# Patient Record
Sex: Male | Born: 1985 | Race: White | Hispanic: Yes | Marital: Married | Smoking: Former smoker
Health system: Southern US, Community
[De-identification: ages and names within clinical notes are randomized; demographics above are authoritative.]

## PROBLEM LIST (undated history)

## (undated) DIAGNOSIS — F419 Anxiety disorder, unspecified: Secondary | ICD-10-CM

---

## 2005-05-30 ENCOUNTER — Emergency Department (HOSPITAL_COMMUNITY): Admission: EM | Admit: 2005-05-30 | Discharge: 2005-05-30 | Payer: Self-pay | Admitting: *Deleted

## 2005-10-28 ENCOUNTER — Emergency Department (HOSPITAL_COMMUNITY): Admission: EM | Admit: 2005-10-28 | Discharge: 2005-10-28 | Payer: Self-pay | Admitting: *Deleted

## 2006-01-10 ENCOUNTER — Emergency Department (HOSPITAL_COMMUNITY): Admission: EM | Admit: 2006-01-10 | Discharge: 2006-01-10 | Payer: Self-pay | Admitting: Emergency Medicine

## 2011-09-28 ENCOUNTER — Emergency Department (HOSPITAL_COMMUNITY)
Admission: EM | Admit: 2011-09-28 | Discharge: 2011-09-29 | Disposition: A | Discharge status: Home / Self Care | Payer: Self-pay | Attending: Emergency Medicine | Admitting: Emergency Medicine

## 2011-09-28 ENCOUNTER — Encounter: Payer: Self-pay | Admitting: Emergency Medicine

## 2011-09-28 DIAGNOSIS — R509 Fever, unspecified: Secondary | ICD-10-CM | POA: Insufficient documentation

## 2011-09-28 DIAGNOSIS — H9209 Otalgia, unspecified ear: Secondary | ICD-10-CM | POA: Insufficient documentation

## 2011-09-28 DIAGNOSIS — R070 Pain in throat: Secondary | ICD-10-CM | POA: Insufficient documentation

## 2011-09-28 DIAGNOSIS — J111 Influenza due to unidentified influenza virus with other respiratory manifestations: Secondary | ICD-10-CM | POA: Insufficient documentation

## 2011-09-28 DIAGNOSIS — IMO0001 Reserved for inherently not codable concepts without codable children: Secondary | ICD-10-CM | POA: Insufficient documentation

## 2011-09-28 DIAGNOSIS — R000 Tachycardia, unspecified: Secondary | ICD-10-CM | POA: Insufficient documentation

## 2011-09-28 DIAGNOSIS — R05 Cough: Secondary | ICD-10-CM | POA: Insufficient documentation

## 2011-09-28 DIAGNOSIS — R059 Cough, unspecified: Secondary | ICD-10-CM | POA: Insufficient documentation

## 2011-09-28 DIAGNOSIS — J3489 Other specified disorders of nose and nasal sinuses: Secondary | ICD-10-CM | POA: Insufficient documentation

## 2011-09-28 MED ORDER — ACETAMINOPHEN 325 MG PO TABS
650.0000 mg | ORAL_TABLET | Freq: Once | ORAL | Status: AC
  Administered 2011-09-28: 650 mg via ORAL
  Filled 2011-09-28: qty 2

## 2011-09-28 NOTE — ED Notes (Signed)
PT. REPORTS PERSISTENT PRODUCTIVE COUGH FOR 3 DAYS WITH LEFT EAR ACHE ,  NASAL CONGESTION / RUNNY NOSE / CHILLS.

## 2011-09-29 ENCOUNTER — Emergency Department (HOSPITAL_COMMUNITY): Payer: Self-pay

## 2011-09-29 MED ORDER — SODIUM CHLORIDE 0.9 % IV BOLUS (SEPSIS)
2000.0000 mL | Freq: Once | INTRAVENOUS | Status: AC
  Administered 2011-09-29: 2000 mL via INTRAVENOUS

## 2011-09-29 MED ORDER — DESLORATADINE 5 MG PO TABS: 5.0000 mg | ORAL_TABLET | Freq: Every day | ORAL | Status: AC

## 2011-09-29 MED ORDER — KETOROLAC TROMETHAMINE 30 MG/ML IJ SOLN
30.0000 mg | Freq: Once | INTRAMUSCULAR | Status: AC
  Administered 2011-09-29: 30 mg via INTRAVENOUS
  Filled 2011-09-29 (×2): qty 1

## 2011-09-29 NOTE — ED Notes (Signed)
Patient transported to X-ray 

## 2011-09-29 NOTE — ED Notes (Signed)
The pt has had cold cough and aching all over for 3-4 days.  No distress

## 2011-09-29 NOTE — ED Provider Notes (Signed)
History     CSN: [id number]  Arrival date & time 09/28/11  2315   First MD Initiated Contact with Patient 09/29/11 0259      Chief Complaint  Patient presents with  . Cough    (Consider location/radiation/quality/duration/timing/severity/associated sxs/prior treatment) Patient is a 25 y.o. male presenting with cough. The history is provided by the patient. No language interpreter was used.  Cough The current episode started more than 2 days ago. The problem occurs constantly. The problem has not changed since onset.The cough is productive of sputum. The maximum temperature recorded prior to his arrival was 103 to 104 F. The fever has been present for 3 to 4 days. Associated symptoms include ear pain, rhinorrhea, sore throat and myalgias. Pertinent negatives include no chest pain, no sweats, no weight loss, no ear congestion, no headaches, no shortness of breath, no wheezing and no eye redness. He has tried nothing for the symptoms. The treatment provided no relief. Risk factors: none, no flu shot. He is not a smoker. His past medical history does not include emphysema or asthma.    History reviewed. No pertinent past medical history.  History reviewed. No pertinent past surgical history.  No family history on file.  History  Substance Use Topics  . Smoking status: Never Smoker   . Smokeless tobacco: Not on file  . Alcohol Use: No      Review of Systems  Constitutional: Positive for fever. Negative for weight loss, activity change and fatigue.  HENT: Positive for ear pain, sore throat and rhinorrhea. Negative for neck pain and neck stiffness.   Eyes: Negative for redness.  Respiratory: Positive for cough. Negative for shortness of breath and wheezing.   Cardiovascular: Negative for chest pain.  Gastrointestinal: Negative for nausea, vomiting, abdominal pain, diarrhea and abdominal distention.  Genitourinary: Negative for difficulty urinating.  Musculoskeletal: Positive for  myalgias.  Skin: Negative.   Neurological: Negative for headaches.  Hematological: Negative.   Psychiatric/Behavioral: Negative.     Allergies  Review of patient's allergies indicates no known allergies.  Home Medications  No current outpatient prescriptions on file.  BP 135/83  Pulse 140  Temp(Src) 103.2 F (39.6 C) (Oral)  Resp 18  SpO2 99%  Physical Exam  Constitutional: He is oriented to person, place, and time. He appears well-developed and well-nourished. No distress.  HENT:  Head: Normocephalic and atraumatic.  Right Ear: Tympanic membrane is not injected.  Left Ear: Tympanic membrane is not injected.  Mouth/Throat: No oropharyngeal exudate.  Eyes: EOM are normal. Pupils are equal, round, and reactive to light.  Neck: Normal range of motion. Neck supple. No thyromegaly present.  Cardiovascular: Tachycardia present.   Pulmonary/Chest: Effort normal. He has no wheezes. He has no rales.  Abdominal: Soft. Bowel sounds are normal. There is no tenderness. There is no rebound and no guarding.  Musculoskeletal: Normal range of motion.  Lymphadenopathy:    He has no cervical adenopathy.  Neurological: He is oriented to person, place, and time.  Skin: Skin is warm and dry. No rash noted. He is not diaphoretic.  Psychiatric: Thought content normal.    ED Course  Procedures (including critical care time)   Labs Reviewed  RAPID STREP SCREEN   No results found.   No diagnosis found.    MDM  Return for worsening symptoms        Makynzi Eastland K Lyly Canizales-Rasch, MD 09/29/11 [phone]

## 2012-05-20 ENCOUNTER — Ambulatory Visit: Payer: Self-pay | Admitting: Internal Medicine

## 2012-05-20 VITALS — BP 144/79 | HR 93 | Temp 99.1°F | Resp 18 | Ht 68.5 in | Wt 204.2 lb

## 2012-05-20 DIAGNOSIS — M79669 Pain in unspecified lower leg: Secondary | ICD-10-CM

## 2012-05-20 DIAGNOSIS — M79609 Pain in unspecified limb: Secondary | ICD-10-CM

## 2012-05-20 MED ORDER — ACYCLOVIR 800 MG PO TABS: ORAL_TABLET | ORAL | Status: DC

## 2012-05-20 MED ORDER — MELOXICAM 15 MG PO TABS: 15.0000 mg | ORAL_TABLET | Freq: Every day | ORAL | Status: AC

## 2012-05-20 NOTE — Progress Notes (Signed)
  Subjective:    Patient ID: Derek Newton, male    DOB: 06-28-1986, 26 y.o.   MRN: [id number]  HPIComplaining of pain and numbness extending from his right outer hip to his right outer ankle for the last 24 hours No known injury He hurts with movement more than when sitting still but had trouble sleeping last night due to this discomfort He noticed a red blisterlike lesion on his right knee last night/still red today but no blister His areas of skin over his hip which he says are sensitive but no other rash Denies fever chills or night sweats Genitourinary symptoms or gastrointestinal symptoms    Review of Systems No current medications or illnesses    Objective:   Physical Exam Vital signs stable Abdomen supple Lumbosacral spine full range of motion without pain on palpation Right hip area tender over the lateral aspect and into the lateral thigh to the knee Decreased sensation to light touch however that however hip thigh and lower leg No clear vesicular rash not resolving red lesion on the Hypersensitive to touch over the buttock without pain on deep palpation Hip has good external and internal rotation without pain/also flexion Straight leg raise produces symptoms along the course of the tender area laterally Deep tendon reflexes are symmetrical There is no complete sensory loss His gait is slow at first but then not affected       Assessment & Plan:  Problem #1 pain and paresthesia with hyperesthesia in a dermatomal distribution  Although the etiology is uncertain, coverage for zoster is indicated He defers hip x-ray No CBC since no fever Close followup necessary Meds ordered this encounter  Medications  . meloxicam (MOBIC) 15 MG tablet    Sig: Take 1 tablet (15 mg total) by mouth daily.    Dispense:  30 tablet    Refill:  0  . acyclovir (ZOVIRAX) 800 MG tablet    Sig: Take 5 tablets a day by taking one every 4 hours while awake    Dispense:  35 tablet   Refill:  0    You may give him 2 are 400 mg tablets if the 800 mg tablets are too expensive   Recheck on Sunday morning-5 days

## 2015-07-17 ENCOUNTER — Ambulatory Visit (INDEPENDENT_AMBULATORY_CARE_PROVIDER_SITE_OTHER): Payer: Self-pay | Admitting: Emergency Medicine

## 2015-07-17 ENCOUNTER — Ambulatory Visit (INDEPENDENT_AMBULATORY_CARE_PROVIDER_SITE_OTHER): Payer: Self-pay

## 2015-07-17 ENCOUNTER — Other Ambulatory Visit: Payer: Self-pay | Admitting: Emergency Medicine

## 2015-07-17 VITALS — BP 128/70 | HR 90 | Temp 99.0°F | Resp 16 | Ht 69.5 in | Wt 184.0 lb

## 2015-07-17 DIAGNOSIS — M79605 Pain in left leg: Secondary | ICD-10-CM

## 2015-07-17 DIAGNOSIS — M545 Low back pain, unspecified: Secondary | ICD-10-CM

## 2015-07-17 DIAGNOSIS — R202 Paresthesia of skin: Secondary | ICD-10-CM

## 2015-07-17 DIAGNOSIS — M461 Sacroiliitis, not elsewhere classified: Secondary | ICD-10-CM

## 2015-07-17 MED ORDER — TRAMADOL HCL 50 MG PO TABS: 50.0000 mg | ORAL_TABLET | Freq: Three times a day (TID) | ORAL | Status: DC | PRN

## 2015-07-17 MED ORDER — CYCLOBENZAPRINE HCL 10 MG PO TABS: ORAL_TABLET | ORAL | Status: DC

## 2015-07-17 MED ORDER — MELOXICAM 15 MG PO TABS: 15.0000 mg | ORAL_TABLET | Freq: Every day | ORAL | Status: DC

## 2015-07-17 NOTE — Progress Notes (Addendum)
Patient ID: Derek Newton, male   DOB: 1985-11-12, 29 y.o.   MRN: [id number]     This chart was scribed for Derek ChrisSteven Arelyn Gauer, MD by Littie Deedsichard Sun, Medical Scribe. This patient was seen in room 13 and the patient's care was started at 3:58 PM.   Chief Complaint:  Chief Complaint  Patient presents with  . Leg Pain    left, x 3 days    HPI: Derek Newton is a 29 y.o. male [facility] complaining of left leg pain from his buttock radiating down his posterior leg that started 4 days ago. Patient reports having associated numbness and tingling in his left leg. The pain is not worse with movement of the hip. The pain is worse when bearing weight. He has tried Tylenol and ibuprofen for the pain with minimal relief. Patient denies back pain. No medical problems per patient.  Patient works at a [employer] shop, which is physically intensive. He has worked every day since the pain started.  History reviewed. No pertinent past medical history. History reviewed. No pertinent past surgical history. Social History   Social History  . Marital Status: Single    Spouse Name: N/A  . Number of Children: N/A  . Years of Education: N/A   Social History Main Topics  . Smoking status: Former Smoker    Types: Cigarettes    Quit date: 10/20/2005  . Smokeless tobacco: None  . Alcohol Use: No  . Drug Use: No  . Sexual Activity: Not Asked   Other Topics Concern  . None   Social History Narrative   History reviewed. No pertinent family history. No Known Allergies Prior to Admission medications   Medication Sig Start Date End Date Taking? Authorizing Provider  acyclovir (ZOVIRAX) 800 MG tablet Take 5 tablets a day by taking one every 4 hours while awake Patient not taking: Reported on 07/17/2015 05/20/12   Tonye Pearsonobert P Doolittle, MD  desloratadine (CLARINEX) 5 MG tablet Take 1 tablet (5 mg total) by mouth daily. 09/29/11 09/28/12  Derek Palumbo, MD     ROS: The patient denies fevers, chills,  night sweats, unintentional weight loss, chest pain, palpitations, wheezing, dyspnea on exertion, nausea, vomiting, abdominal pain, dysuria, hematuria, melena.  All other systems have been reviewed and were otherwise negative with the exception of those mentioned in the HPI and as above.    PHYSICAL EXAM: Filed Vitals:   07/17/15 1417  BP: 128/70  Pulse: 90  Temp: 99 F (37.2 C)  Resp: 16   Body mass index is 26.79 kg/(m^2).   General: Alert, no acute distress HEENT:  Normocephalic, atraumatic, oropharynx patent. Eye[facility] Non[facility]OMI[facility] Cardiovascular:  Regular rate and rhythm, no rubs murmurs or gallops.  No Carotid bruits, radial pulse intact. No pedal edema.  Respiratory: Clear to auscultation bilaterally.  No wheezes, rales, or rhonchi.  No cyanosis, no use of accessory musculature Abdominal: No organomegaly, abdomen is soft and non-tender, positive bowel sounds.  No masses. Musculoskeletal: No tenderness over lower lumbar spine. He is able to internal and external rotate the left hip. Very tender in the sciatic notch on the left. Straight leg raising positive on the left at 70 degrees. Skin: No rashes. Neurologic: DTRs are 2+ knees, 2+ right ankle, 1+ left ankle.  Psychiatric: Patient acts appropriately throughout our interaction. Lymphatic: No cervical or submandibular lymphadenopathy    LABS:    EKG/XRAY:   [facility] DG Lumbar Spine/Left Femur - Femur  films are normal. Back films are normal. There may be mild degenerative disc change at L5-S1. The radiologist felt there were signs of bilateral sacroiliitis.   ASSESSMENT/PLAN: I suspect this is a sciatica involving the left leg. He works at a [employer] and certainly would be high risk to injure his back. I do not see anything on his films. I have taken him out of work until Thursday. He will be on mobile, Flexeril, and Ultram for breakthrough pain. Recheck in 3-4 days if he is  not improving. Referral made to rheumatology because of suspected sacroiliitis. I will see if patient will return to clinic Wednesday for blood work.  By signing my name below, I, Littie Deeds, attest that this documentation has been prepared under the direction and in the presence of Derek Chris, MD.  Electronically Signed: Littie Deeds, Medical Scribe. 07/17/2015. 3:58 PM.   Gross sideeffects, risk and benefits, and alternatives of medications d/w patient. Patient is aware that all medications have potential sideeffects and we are unable to predict every sideeffect or drug-drug interaction that may occur.  Derek Chris MD 07/17/2015 3:58 PM

## 2015-07-17 NOTE — Addendum Note (Signed)
Addended by: Lesle ChrisAUB, Rand Etchison A on: 07/17/2015 06:13 PM   Modules accepted: Orders

## 2015-07-17 NOTE — Patient Instructions (Signed)

## 2020-01-02 ENCOUNTER — Ambulatory Visit: Payer: Self-pay | Attending: Internal Medicine

## 2020-01-02 DIAGNOSIS — Z23 Encounter for immunization: Secondary | ICD-10-CM

## 2020-01-02 NOTE — Progress Notes (Signed)
   Covid-19 Vaccination Clinic  Name:  Derek Newton    MRN: [id number] DOB: 06-16-1986  01/02/2020  Derek Newton was observed post Covid-19 immunization for 15 minutes without incident. He was provided with Vaccine Information Sheet and instruction to access the V-Safe system.   Derek Newton was instructed to call 911 with any severe reactions post vaccine: Marland Kitchen Difficulty breathing  . Swelling of face and throat  . A fast heartbeat  . A bad rash all over body  . Dizziness and weakness   Immunizations Administered    Name Date Dose VIS Date Route   Pfizer COVID-19 Vaccine 01/02/2020  4:04 PM 0.3 mL 09/11/2019 Intramuscular   Manufacturer: [employer], [employer]   Lot: [id number]   NDC: [address]-2

## 2020-01-26 ENCOUNTER — Ambulatory Visit: Payer: Self-pay | Attending: Internal Medicine

## 2020-01-26 DIAGNOSIS — Z23 Encounter for immunization: Secondary | ICD-10-CM

## 2020-01-26 NOTE — Progress Notes (Signed)
   Covid-19 Vaccination Clinic  Name:  Bryse Blanchette    MRN: [id number] DOB: [birth year]  01/26/2020  Mr. On was observed post Covid-19 immunization for 30 minutes based on pre-vaccination screening without incident. He was provided with Vaccine Information Sheet and instruction to access the V-Safe system.   Mr. Dock was instructed to call 911 with any severe reactions post vaccine: Marland Kitchen Difficulty breathing  . Swelling of face and throat  . A fast heartbeat  . A bad rash all over body  . Dizziness and weakness   Immunizations Administered    Name Date Dose VIS Date Route   Pfizer COVID-19 Vaccine 01/26/2020  3:59 PM 0.3 mL 11/25/2018 Intramuscular   Manufacturer: [employer], [employer]   Lot: [id number]   NDC: [address]-2

## 2020-09-09 ENCOUNTER — Emergency Department (HOSPITAL_COMMUNITY): Payer: Self-pay

## 2020-09-09 ENCOUNTER — Encounter (HOSPITAL_COMMUNITY): Payer: Self-pay | Admitting: Emergency Medicine

## 2020-09-09 ENCOUNTER — Ambulatory Visit (HOSPITAL_COMMUNITY)
Admission: EM | Admit: 2020-09-09 | Discharge: 2020-09-09 | Disposition: A | Discharge status: Other Acute Inpatient Hospital | Payer: Self-pay | Attending: Emergency Medicine | Admitting: Emergency Medicine

## 2020-09-09 ENCOUNTER — Encounter (HOSPITAL_COMMUNITY): Payer: Self-pay

## 2020-09-09 ENCOUNTER — Emergency Department (HOSPITAL_COMMUNITY)
Admission: EM | Admit: 2020-09-09 | Discharge: 2020-09-10 | Disposition: A | Discharge status: Home / Self Care | Payer: Self-pay | Attending: Emergency Medicine | Admitting: Emergency Medicine

## 2020-09-09 ENCOUNTER — Other Ambulatory Visit: Payer: Self-pay

## 2020-09-09 DIAGNOSIS — R42 Dizziness and giddiness: Secondary | ICD-10-CM

## 2020-09-09 DIAGNOSIS — R0602 Shortness of breath: Secondary | ICD-10-CM

## 2020-09-09 DIAGNOSIS — R079 Chest pain, unspecified: Secondary | ICD-10-CM

## 2020-09-09 DIAGNOSIS — R0789 Other chest pain: Secondary | ICD-10-CM | POA: Insufficient documentation

## 2020-09-09 DIAGNOSIS — Z87891 Personal history of nicotine dependence: Secondary | ICD-10-CM | POA: Insufficient documentation

## 2020-09-09 LAB — CBC
HCT: 42.9 % (ref 39.0–52.0)
Hemoglobin: 15 g/dL (ref 13.0–17.0)
MCH: 30.4 pg (ref 26.0–34.0)
MCHC: 35 g/dL (ref 30.0–36.0)
MCV: 87 fL (ref 80.0–100.0)
Platelets: 209 10*3/uL (ref 150–400)
RBC: 4.93 MIL/uL (ref 4.22–5.81)
RDW: 11.9 % (ref 11.5–15.5)
WBC: 6.8 10*3/uL (ref 4.0–10.5)
nRBC: 0 % (ref 0.0–0.2)

## 2020-09-09 LAB — PROTIME-INR
INR: 0.9 (ref 0.8–1.2)
Prothrombin Time: 11.9 seconds (ref 11.4–15.2)

## 2020-09-09 NOTE — Discharge Instructions (Addendum)
Go to the emergency department for evaluation of your chest pain, shortness of breath, lightheadedness.

## 2020-09-09 NOTE — ED Provider Notes (Signed)
[facility]    CSN: [id number] Arrival date & time: 09/09/20  1853      History   Chief Complaint Chief Complaint  Patient presents with  . Dizziness  . Shortness of Breath  . Chest Pain    HPI Jonatha Newton is a 34 y.o. male.   Patient presents with lightheadedness, chest pain, shortness of breath today.  He currently has mild chest pressure midsternal.  He denies focal weakness, headache, numbness, shortness of breath, dizziness, abdominal pain, nausea, vomiting, diaphoresis or other symptoms.  No treatments attempted at home.  He denies pertinent medical history.  The history is provided by the patient.    History reviewed. No pertinent past medical history.  There are no problems to display for this patient.   History reviewed. No pertinent surgical history.     Home Medications    Prior to Admission medications   Medication Sig Start Date End Date Taking? Authorizing Provider  acyclovir (ZOVIRAX) 800 MG tablet Take 5 tablets a day by taking one every 4 hours while awake Patient not taking: Reported on 07/17/2015 05/20/12   Tonye Pearson, MD  cyclobenzaprine (FLEXERIL) 10 MG tablet 1 daily at bedtime 07/17/15   Collene Gobble, MD  desloratadine (CLARINEX) 5 MG tablet Take 1 tablet (5 mg total) by mouth daily. 09/29/11 09/28/12  Palumbo, April, MD  meloxicam (MOBIC) 15 MG tablet Take 1 tablet (15 mg total) by mouth daily. 07/17/15   Collene Gobble, MD  traMADol (ULTRAM) 50 MG tablet Take 1 tablet (50 mg total) by mouth every 8 (eight) hours as needed. 07/17/15   Collene Gobble, MD    Family History History reviewed. No pertinent family history.  Social History Social History   Tobacco Use  . Smoking status: Former Smoker    Types: Cigarettes    Quit date: 10/20/2005    Years since quitting: 14.8  Substance Use Topics  . Alcohol use: No  . Drug use: No     Allergies   Watermelon flavor   Review of Systems Review of  Systems  Constitutional: Negative for chills, diaphoresis and fever.  HENT: Negative for ear pain and sore throat.   Eyes: Negative for pain and visual disturbance.  Respiratory: Positive for shortness of breath. Negative for cough.   Cardiovascular: Positive for chest pain. Negative for palpitations.  Gastrointestinal: Negative for abdominal pain, nausea and vomiting.  Genitourinary: Negative for dysuria and hematuria.  Musculoskeletal: Negative for arthralgias and back pain.  Skin: Negative for color change and rash.  Neurological: Positive for light-headedness. Negative for dizziness, seizures, syncope, facial asymmetry, speech difficulty, weakness, numbness and headaches.  All other systems reviewed and are negative.    Physical Exam Triage Vital Signs ED Triage Vitals  Enc Vitals Group     BP 09/09/20 1933 (!) 149/92     Pulse Rate 09/09/20 1933 (!) 111     Resp 09/09/20 1933 17     Temp 09/09/20 1933 99.3 F (37.4 C)     Temp Source 09/09/20 1933 Oral     SpO2 09/09/20 1933 99 %     Weight --      Height --      Head Circumference --      Peak Flow --      Pain Score 09/09/20 1930 0     Pain Loc --      Pain Edu? --      Excl. in GC? --  No data found.  Updated Vital Signs BP (!) 149/92 (BP Location: Left Arm)   Pulse (!) 111   Temp 99.3 F (37.4 C) (Oral)   Resp 17   SpO2 99%   Visual Acuity Right Eye Distance:   Left Eye Distance:   Bilateral Distance:    Right Eye Near:   Left Eye Near:    Bilateral Near:     Physical Exam Vitals and nursing note reviewed.  Constitutional:      General: He is not in acute distress.    Appearance: He is well-developed and well-nourished. He is not ill-appearing.  HENT:     Head: Normocephalic and atraumatic.     Mouth/Throat:     Mouth: Mucous membranes are moist.  Eyes:     Conjunctiva/sclera: Conjunctivae normal.  Cardiovascular:     Rate and Rhythm: Normal rate and regular rhythm.     Heart sounds:  Normal heart sounds.  Pulmonary:     Effort: Pulmonary effort is normal. No respiratory distress.     Breath sounds: Normal breath sounds. No wheezing, rhonchi or rales.  Abdominal:     Palpations: Abdomen is soft.     Tenderness: There is no abdominal tenderness. There is no guarding or rebound.  Musculoskeletal:        General: No edema.     Cervical back: Neck supple.  Skin:    General: Skin is warm and dry.  Neurological:     General: No focal deficit present.     Mental Status: He is alert and oriented to person, place, and time.     Sensory: No sensory deficit.     Motor: No weakness.     Gait: Gait normal.  Psychiatric:        Mood and Affect: Mood and affect and mood normal.        Behavior: Behavior normal.      UC Treatments / Results  Labs (all labs ordered are listed, but only abnormal results are displayed) Labs Reviewed - No data to display  EKG   Radiology No results found.  Procedures Procedures (including critical care time)  Medications Ordered in UC Medications - No data to display  Initial Impression / Assessment and Plan / UC Course  I have reviewed the triage vital signs and the nursing notes.  Pertinent labs & imaging results that were available during my care of the patient were reviewed by me and considered in my medical decision making (see chart for details).   Chest pain, Shortness of breath, lightheadedness.  EKG shows sinus tachycardia, rate 112, inverted T wave in leads III, aVR, V1, no previous to compare.  Sending patient to the ED for evaluation.   Final Clinical Impressions(s) / UC Diagnoses   Final diagnoses:  Chest pain, unspecified type  Shortness of breath  Lightheadedness     Discharge Instructions     Go to the emergency department for evaluation of your chest pain, shortness of breath, lightheadedness.    ED Prescriptions    None     PDMP not reviewed this encounter.   Mickie Bail, NP 09/09/20 2035

## 2020-09-09 NOTE — ED Triage Notes (Signed)
Patient reports central chest pain with SOB and dizziness this evening , no emesis or diaphoresis , denies cough or fever .

## 2020-09-09 NOTE — ED Triage Notes (Signed)
Pt presents with dizziness, chest tightness and SOB x 3 days. Pt denies fever, nasal/chest congestion and cough. Pt states he has not been exposed to anyone with COVID or suspected COVID. Pt denies vomiting and nausea.

## 2020-09-09 NOTE — ED Notes (Signed)
Patient is being discharged from the Urgent Care and sent to the Emergency Department via Personal Vehicle . Per Tresa Endo NP, patient is in need of higher level of care due to Chest Pain and Dizziness. Patient is aware and verbalizes understanding of plan of care.  Vitals:   09/09/20 1933  BP: (!) 149/92  Pulse: (!) 111  Resp: 17  Temp: 99.3 F (37.4 C)  SpO2: 99%

## 2020-09-10 LAB — BASIC METABOLIC PANEL
Anion gap: 9 (ref 5–15)
BUN: 14 mg/dL (ref 6–20)
CO2: 24 mmol/L (ref 22–32)
Calcium: 9.8 mg/dL (ref 8.9–10.3)
Chloride: 103 mmol/L (ref 98–111)
Creatinine, Ser: 0.67 mg/dL (ref 0.61–1.24)
GFR, Estimated: >60 mL/min (ref >60)
Glucose, Bld: 105 mg/dL — ABNORMAL HIGH (ref 70–99)
Potassium: 4.6 mmol/L (ref 3.5–5.1)
Sodium: 136 mmol/L (ref 135–145)

## 2020-09-10 LAB — TROPONIN I (HIGH SENSITIVITY)
Troponin I (High Sensitivity): <2 ng/L (ref <18)
Troponin I (High Sensitivity): <2 ng/L (ref <18)

## 2020-09-10 LAB — D-DIMER, QUANTITATIVE: D-Dimer, Quant: <0.27 ug/mL-FEU (ref 0.00–0.50)

## 2020-09-10 MED ORDER — SODIUM CHLORIDE 0.9 % IV BOLUS
1000.0000 mL | Freq: Once | INTRAVENOUS | Status: AC
  Administered 2020-09-10: 1000 mL via INTRAVENOUS

## 2020-09-10 MED ORDER — KETOROLAC TROMETHAMINE 30 MG/ML IJ SOLN
30.0000 mg | Freq: Once | INTRAMUSCULAR | Status: AC
  Administered 2020-09-10: 30 mg via INTRAVENOUS
  Filled 2020-09-10: qty 1

## 2020-09-10 NOTE — ED Provider Notes (Signed)
[facility] EMERGENCY DEPARTMENT Provider Note   CSN: [id number] Arrival date & time: 09/09/20  2041     History Chief Complaint  Patient presents with  . Chest Pain    Derek Newton is a 34 y.o. male.  Derek Newton is a 34 y.o. male who is otherwise healthy, who presents to the ED from urgent care for evaluation of chest pain, shortness of breath and lightheadedness.  He reports the symptoms started around 5:30 yesterday evening.  Symptoms came on fairly suddenly.  He reports a sharp chest pain in the left lower chest associated with shortness of breath.  Denies pleuritic pain.  Pain is not worse with exertion.  Pain is nonradiating.  Reports he also felt lightheaded when pain started, but did not pass out.  Initially went to urgent care but was sent to the ED for further evaluation.  Noted to be mildly tachycardic at urgent care, but all other vitals normal.  No associated diaphoresis, nausea, vomiting or abdominal pain.  No history of similar pain before.  No history of heart or lung disease.  Remote history of smoking but has not smoked in 14 years, denies alcohol or drug use.  No history of PE or DVT, no lower extremity pain or swelling, no recent travel or surgery, no history of prior Covid infections.  No associated fevers, chills or cough.  He has not taken any medications prior to arrival, no other aggravating or alleviating factors.        History reviewed. No pertinent past medical history.  There are no problems to display for this patient.   History reviewed. No pertinent surgical history.     No family history on file.  Social History   Tobacco Use  . Smoking status: Former Smoker    Types: Cigarettes    Quit date: 10/20/2005    Years since quitting: 14.9  . Smokeless tobacco: Never Used  Substance Use Topics  . Alcohol use: No  . Drug use: No    Home Medications Prior to Admission medications   Medication Sig Start  Date End Date Taking? Authorizing Provider  acyclovir (ZOVIRAX) 800 MG tablet Take 5 tablets a day by taking one every 4 hours while awake Patient not taking: Reported on 07/17/2015 05/20/12   Tonye Pearson, MD  cyclobenzaprine (FLEXERIL) 10 MG tablet 1 daily at bedtime 07/17/15   Collene Gobble, MD  desloratadine (CLARINEX) 5 MG tablet Take 1 tablet (5 mg total) by mouth daily. 09/29/11 09/28/12  Palumbo, April, MD  meloxicam (MOBIC) 15 MG tablet Take 1 tablet (15 mg total) by mouth daily. 07/17/15   Collene Gobble, MD  traMADol (ULTRAM) 50 MG tablet Take 1 tablet (50 mg total) by mouth every 8 (eight) hours as needed. 07/17/15   Collene Gobble, MD    Allergies    Watermelon flavor  Review of Systems   Review of Systems  Constitutional: Negative for chills and fever.  HENT: Negative.   Eyes: Negative for visual disturbance.  Respiratory: Positive for shortness of breath. Negative for cough.   Cardiovascular: Positive for chest pain.  Gastrointestinal: Negative for abdominal pain, nausea and vomiting.  Genitourinary: Negative for dysuria and frequency.  Musculoskeletal: Negative for arthralgias and myalgias.  Skin: Negative for color change.  Neurological: Positive for light-headedness. Negative for dizziness, syncope, weakness, numbness and headaches.  All other systems reviewed and are negative.   Physical Exam Updated Vital Signs BP 115/87  Pulse 78   Temp 98.9 F (37.2 C) (Oral)   Resp 15   Ht 5\' 8"  (1.727 m)   Wt 90 kg   SpO2 97%   BMI 30.17 kg/m   Physical Exam Vitals and nursing note reviewed.  Constitutional:      General: He is not in acute distress.    Appearance: He is well-developed and well-nourished. He is not ill-appearing or diaphoretic.     Comments: Well-appearing and in no distress   HENT:     Head: Normocephalic and atraumatic.     Mouth/Throat:     Mouth: Oropharynx is clear and moist.  Eyes:     General:        Right eye: No discharge.         Left eye: No discharge.     Extraocular Movements: EOM normal.     Pupils: Pupils are equal, round, and reactive to light.  Cardiovascular:     Rate and Rhythm: Normal rate and regular rhythm.     Pulses: Intact distal pulses.          Radial pulses are 2+ on the right side and 2+ on the left side.       Dorsalis pedis pulses are 2+ on the right side and 2+ on the left side.     Heart sounds: Normal heart sounds. No murmur heard. No friction rub. No gallop.   Pulmonary:     Effort: Pulmonary effort is normal. No respiratory distress.     Breath sounds: Normal breath sounds. No wheezing or rales.     Comments: Respirations equal and unlabored, patient able to speak in full sentences, lungs clear to auscultation bilaterally  Chest:     Chest wall: No tenderness.     Comments: No tenderness, no overlying skin changes or palpable deformity Abdominal:     General: Bowel sounds are normal. There is no distension.     Palpations: Abdomen is soft. There is no mass.     Tenderness: There is no abdominal tenderness. There is no guarding.  Musculoskeletal:        General: No deformity or edema.     Cervical back: Neck supple.     Right lower leg: No tenderness. No edema.     Left lower leg: No tenderness. No edema.  Skin:    General: Skin is warm and dry.     Capillary Refill: Capillary refill takes less than 2 seconds.  Neurological:     Mental Status: He is alert.     Coordination: Coordination normal.     Comments: Speech is clear, able to follow commands Moves extremities without ataxia, coordination intact  Psychiatric:        Mood and Affect: Mood normal.        Behavior: Behavior normal.     ED Results / Procedures / Treatments   Labs (all labs ordered are listed, but only abnormal results are displayed) Labs Reviewed  BASIC METABOLIC PANEL - Abnormal; Notable for the following components:      Result Value   Glucose, Bld 105 (*)    All other components within normal  limits  CBC  PROTIME-INR  D-DIMER, QUANTITATIVE (NOT AT [facility])  TROPONIN I (HIGH SENSITIVITY)  TROPONIN I (HIGH SENSITIVITY)    EKG EKG Interpretation  Date/Time:  Friday September 09 2020 21:51:16 EST Ventricular Rate:  120 PR Interval:  138 QRS Duration: 76 QT Interval:  308 QTC Calculation: 435 R Axis:  36 Text Interpretation: Sinus tachycardia Otherwise normal ECG Confirmed by Zadie Rhine ([id number]) on 09/10/2020 8:53:01 AM   Radiology DG Chest 2 View  Result Date: 09/09/2020 CLINICAL DATA:  Chest pain EXAM: CHEST - 2 VIEW COMPARISON:  09/29/2011 FINDINGS: The heart size and mediastinal contours are within normal limits. Both lungs are clear. The visualized skeletal structures are unremarkable. IMPRESSION: Negative. Electronically Signed   By: Charlett Nose M.D.   On: 09/09/2020 22:25    Procedures Procedures (including critical care time)  Medications Ordered in ED Medications  sodium chloride 0.9 % bolus 1,000 mL (1,000 mLs Intravenous New Bag/Given 09/10/20 0849)  ketorolac (TORADOL) 30 MG/ML injection 30 mg (30 mg Intravenous Given 09/10/20 1003)    ED Course  I have reviewed the triage vital signs and the nursing notes.  Pertinent labs & imaging results that were available during my care of the patient were reviewed by me and considered in my medical decision making (see chart for details).    MDM Rules/Calculators/A&P                          Patient presents to the emergency department with chest pain. Patient nontoxic appearing, in no apparent distress, vitals without significant abnormality. Fairly benign physical exam.   DDX: ACS, pulmonary embolism, dissection, pneumothorax, pneumonia, arrhythmia, severe anemia, MSK, GERD, anxiety. Evaluation initiated with labs, EKG, and CXR. Patient on cardiac monitor.   CBC: No leukocytosis, normal hemoglobin  BMP: Glucose of 105, no other electrolyte derangements, normal renal function Troponin: Negative  x2 D-dimer: Negative EKG: Sinus tachycardia, no T wave or ST changes, no signs of ischemia CXR:  negative, without infiltrate, effusion, pneumothorax, or fracture/dislocation.    Heart Pathway Score 0 - EKG without obvious acute ischemia, delta troponin negative, doubt ACS. D-dimer negative, doubt pulmonary embolism. Pain is not a tearing sensation, symmetric pulses, no widening of mediastinum on CXR, doubt dissection. Cardiac monitor reviewed, no notable arrhythmias or tachycardia. Patient has appeared hemodynamically stable throughout ER visit and appears safe for discharge with close PCP/cardiology follow up. I discussed results, treatment plan, need for PCP follow-up, and return precautions with the patient. Provided opportunity for questions, patient confirmed understanding and is in agreement with plan.   Final Clinical Impression(s) / ED Diagnoses Final diagnoses:  Central chest pain  Shortness of breath  Light headedness    Rx / DC Orders ED Discharge Orders    None       Dartha Lodge, [place] 09/10/20 1007    LongArlyss Repress, MD 09/11/20 [phone]

## 2020-09-10 NOTE — ED Notes (Signed)
Breakfast Ordered 

## 2020-09-10 NOTE — ED Notes (Signed)
Patient verbalizes understanding of discharge instructions. Opportunity for questioning and answers were provided. Armband removed by staff, pt discharged.

## 2020-09-10 NOTE — Discharge Instructions (Signed)
You were seen in the emergency department today for chest pain. Your work-up in the emergency department has been overall reassuring. Your labs have been fairly normal and or similar to previous blood work you have had done. Your EKG and the enzyme we use to check your heart did not show an acute heart attack at this time. Your chest x-ray was normal.   We would like you to follow up closely with your primary care provider and/or the cardiologist provided in your discharge instructions. Return to the ER immediately should you experience any new or worsening symptoms including but not limited to return of pain, worsened pain, vomiting, shortness of breath, dizziness, lightheadedness, passing out, or any other concerns that you may have.   

## 2021-10-07 ENCOUNTER — Emergency Department (HOSPITAL_COMMUNITY): Payer: Self-pay

## 2021-10-07 ENCOUNTER — Emergency Department (HOSPITAL_COMMUNITY)
Admission: EM | Admit: 2021-10-07 | Discharge: 2021-10-07 | Disposition: A | Discharge status: Home / Self Care | Payer: Self-pay | Attending: Emergency Medicine | Admitting: Emergency Medicine

## 2021-10-07 ENCOUNTER — Encounter (HOSPITAL_COMMUNITY): Payer: Self-pay | Admitting: Emergency Medicine

## 2021-10-07 DIAGNOSIS — Z20822 Contact with and (suspected) exposure to covid-19: Secondary | ICD-10-CM | POA: Insufficient documentation

## 2021-10-07 DIAGNOSIS — R531 Weakness: Secondary | ICD-10-CM

## 2021-10-07 DIAGNOSIS — Z79899 Other long term (current) drug therapy: Secondary | ICD-10-CM | POA: Insufficient documentation

## 2021-10-07 DIAGNOSIS — R519 Headache, unspecified: Secondary | ICD-10-CM | POA: Insufficient documentation

## 2021-10-07 DIAGNOSIS — M6281 Muscle weakness (generalized): Secondary | ICD-10-CM | POA: Insufficient documentation

## 2021-10-07 LAB — COMPREHENSIVE METABOLIC PANEL
ALT: 38 U/L (ref 0–44)
AST: 32 U/L (ref 15–41)
Albumin: 4.5 g/dL (ref 3.5–5.0)
Alkaline Phosphatase: 94 U/L (ref 38–126)
Anion gap: 9 (ref 5–15)
BUN: 10 mg/dL (ref 6–20)
CO2: 25 mmol/L (ref 22–32)
Calcium: 9.6 mg/dL (ref 8.9–10.3)
Chloride: 106 mmol/L (ref 98–111)
Creatinine, Ser: 0.7 mg/dL (ref 0.61–1.24)
GFR, Estimated: >60 mL/min (ref >60)
Glucose, Bld: 110 mg/dL — ABNORMAL HIGH (ref 70–99)
Potassium: 4.1 mmol/L (ref 3.5–5.1)
Sodium: 140 mmol/L (ref 135–145)
Total Bilirubin: 0.8 mg/dL (ref 0.3–1.2)
Total Protein: 7.7 g/dL (ref 6.5–8.1)

## 2021-10-07 LAB — CBC
HCT: 44.4 % (ref 39.0–52.0)
Hemoglobin: 15 g/dL (ref 13.0–17.0)
MCH: 30 pg (ref 26.0–34.0)
MCHC: 33.8 g/dL (ref 30.0–36.0)
MCV: 88.8 fL (ref 80.0–100.0)
Platelets: 182 10*3/uL (ref 150–400)
RBC: 5 MIL/uL (ref 4.22–5.81)
RDW: 11.8 % (ref 11.5–15.5)
WBC: 6.1 10*3/uL (ref 4.0–10.5)
nRBC: 0 % (ref 0.0–0.2)

## 2021-10-07 LAB — DIFFERENTIAL
Abs Immature Granulocytes: 0.03 10*3/uL (ref 0.00–0.07)
Basophils Absolute: 0.1 10*3/uL (ref 0.0–0.1)
Basophils Relative: 1 %
Eosinophils Absolute: 0 10*3/uL (ref 0.0–0.5)
Eosinophils Relative: 0 %
Immature Granulocytes: 1 %
Lymphocytes Relative: 16 %
Lymphs Abs: 1 10*3/uL (ref 0.7–4.0)
Monocytes Absolute: 0.3 10*3/uL (ref 0.1–1.0)
Monocytes Relative: 6 %
Neutro Abs: 4.7 10*3/uL (ref 1.7–7.7)
Neutrophils Relative %: 76 %

## 2021-10-07 LAB — PROTIME-INR
INR: 1 (ref 0.8–1.2)
Prothrombin Time: 12.7 seconds (ref 11.4–15.2)

## 2021-10-07 LAB — RESP PANEL BY RT-PCR (FLU A&B, COVID) ARPGX2
Influenza A by PCR: NEGATIVE
Influenza B by PCR: NEGATIVE
SARS Coronavirus 2 by RT PCR: NEGATIVE

## 2021-10-07 LAB — I-STAT CHEM 8, ED
BUN: 13 mg/dL (ref 6–20)
Calcium, Ion: 1.08 mmol/L — ABNORMAL LOW (ref 1.15–1.40)
Chloride: 108 mmol/L (ref 98–111)
Creatinine, Ser: 0.7 mg/dL (ref 0.61–1.24)
Glucose, Bld: 110 mg/dL — ABNORMAL HIGH (ref 70–99)
HCT: 44 % (ref 39.0–52.0)
Hemoglobin: 15 g/dL (ref 13.0–17.0)
Potassium: 4.5 mmol/L (ref 3.5–5.1)
Sodium: 139 mmol/L (ref 135–145)
TCO2: 24 mmol/L (ref 22–32)

## 2021-10-07 LAB — TROPONIN I (HIGH SENSITIVITY): Troponin I (High Sensitivity): 4 ng/L (ref <18)

## 2021-10-07 LAB — APTT: aPTT: 31 seconds (ref 24–36)

## 2021-10-07 LAB — MAGNESIUM: Magnesium: 2 mg/dL (ref 1.7–2.4)

## 2021-10-07 MED ORDER — SODIUM CHLORIDE 0.9% FLUSH: 3.0000 mL | Freq: Once | INTRAVENOUS | Status: DC

## 2021-10-07 NOTE — ED Provider Notes (Signed)
[facility] Provider Note   CSN: [id number] Arrival date & time: 10/07/21  1151     History  CC: Weakness   Derek Newton is a 36 y.o. male presenting to emerge apartment with generalized weakness and left arm numbness and heaviness.  Patient reports that for the past 3 days he has felt generally weak, fatigued, low energy.  He had a mild headache.  He denies cough, congestion, nausea, vomiting, diarrhea.  He works at a [employer] shop.  He says he was at work this morning, and after lifting a heavy tire, noted that his both of his arms felt weak, but the weakness and paresthesia in the left arm persisted.  He says the entire left arm feels heavy.  He reported perhaps some paresthesias in the left off of his face.  Overall his symptoms have completely resolved since coming to the ED, aside from his general fatigue.  He denies any chest pain or pressure.  He denies any medical problems.  He denies any significant family history of MI, or stroke.  He denies any history of smoking or illicit drug use.  He says he otherwise has been healthy.  HPI     Home Medications Prior to Admission medications   Medication Sig Start Date End Date Taking? Authorizing Provider  acyclovir (ZOVIRAX) 800 MG tablet Take 5 tablets a day by taking one every 4 hours while awake Patient not taking: Reported on 07/17/2015 05/20/12   Tonye Pearsonoolittle, Robert P, MD  cyclobenzaprine (FLEXERIL) 10 MG tablet 1 daily at bedtime 07/17/15   Collene Gobbleaub, Steven A, MD  desloratadine (CLARINEX) 5 MG tablet Take 1 tablet (5 mg total) by mouth daily. 09/29/11 09/28/12  Palumbo, April, MD  meloxicam (MOBIC) 15 MG tablet Take 1 tablet (15 mg total) by mouth daily. 07/17/15   Collene Gobbleaub, Steven A, MD  traMADol (ULTRAM) 50 MG tablet Take 1 tablet (50 mg total) by mouth every 8 (eight) hours as needed. 07/17/15   Collene Gobbleaub, Steven A, MD      Allergies    Watermelon flavor    Review of Systems   Review of  Systems  Physical Exam Updated Vital Signs BP 120/86    Pulse 94    Temp 98.3 F (36.8 C) (Oral)    Resp 19    Ht 5\' 7"  (1.702 m)    Wt 88.5 kg    SpO2 100%    BMI 30.54 kg/m  Physical Exam Constitutional:      General: He is not in acute distress. HENT:     Head: Normocephalic and atraumatic.  Eyes:     Conjunctiva/sclera: Conjunctivae normal.     Pupils: Pupils are equal, round, and reactive to light.  Cardiovascular:     Rate and Rhythm: Normal rate and regular rhythm.     Pulses: Normal pulses.  Pulmonary:     Effort: Pulmonary effort is normal. No respiratory distress.  Abdominal:     General: There is no distension.     Tenderness: There is no abdominal tenderness.  Musculoskeletal:        General: No swelling. Normal range of motion.  Skin:    General: Skin is warm and dry.  Neurological:     General: No focal deficit present.     Mental Status: He is alert and oriented to person, place, and time. Mental status is at baseline.     Cranial Nerves: No cranial nerve deficit.     Sensory: No  sensory deficit.     Motor: No weakness.     Coordination: Coordination normal.     Gait: Gait normal.     Deep Tendon Reflexes: Reflexes normal.  Psychiatric:        Mood and Affect: Mood normal.        Behavior: Behavior normal.    ED Results / Procedures / Treatments   Labs (all labs ordered are listed, but only abnormal results are displayed) Labs Reviewed  COMPREHENSIVE METABOLIC PANEL - Abnormal; Notable for the following components:      Result Value   Glucose, Bld 110 (*)    All other components within normal limits  I-STAT CHEM 8, ED - Abnormal; Notable for the following components:   Glucose, Bld 110 (*)    Calcium, Ion 1.08 (*)    All other components within normal limits  RESP PANEL BY RT-PCR (FLU A&B, COVID) ARPGX2  PROTIME-INR  APTT  CBC  DIFFERENTIAL  MAGNESIUM  CBG MONITORING, ED  TROPONIN I (HIGH SENSITIVITY)  TROPONIN I (HIGH SENSITIVITY)     EKG EKG Interpretation  Date/Time:  Saturday October 07 2021 14:32:19 EST Ventricular Rate:  100 PR Interval:  141 QRS Duration: 88 QT Interval:  329 QTC Calculation: 425 R Axis:   73 Text Interpretation: Sinus tachycardia Borderline T abnormalities, inferior leads No sig change from prior tracing Dec 2021, no STEMI Confirmed by Alvester Chou [phone]) on 10/07/2021 3:03:57 PM  Radiology CT HEAD WO CONTRAST  Result Date: 10/07/2021 CLINICAL DATA:  Left-sided numbness and tingling. EXAM: CT HEAD WITHOUT CONTRAST TECHNIQUE: Contiguous axial images were obtained from the base of the skull through the vertex without intravenous contrast. COMPARISON:  None. FINDINGS: Brain: No evidence of acute infarction, hemorrhage, hydrocephalus, extra-axial collection or mass lesion/mass effect. Vascular: No hyperdense vessel or unexpected calcification. Skull: Normal. Negative for fracture or focal lesion. Sinuses/Orbits: No acute finding. Other: None. IMPRESSION: Normal noncontrast head CT. Electronically Signed   By: Obie Dredge M.D.   On: 10/07/2021 13:51    Procedures Procedures    Medications Ordered in ED Medications - No data to display   ED Course/ Medical Decision Making/ A&P Clinical Course as of 10/08/21 0615  Sat Oct 07, 2021  1405 CTH unremarkable.  Will draw trop now [MT]  1538 Patient remains asymptomatic at this time.  We are awaiting his troponin level.  The rest of his work-up has been unremarkable.  I explained to him that I suspect he may still have a viral syndrome given his symptoms the past 3 days, I have a lower suspicion for PE, aortic dissection or stroke based on this presentation.  Doubt sepsis.  I would advise that he keep himself hydrated, rest for the next 2 days at home, and return precautions were discussed.  He verbalized understanding [MT]    Clinical Course User Index [MT] Isabel Freese, Kermit Balo, MD                           Medical Decision Making  This  patient presents to the ED with concern for generalized weakness. This involves an extensive number of treatment options, and is a complaint that carries with it a high risk of complications and morbidity.  The differential diagnosis includes viral syndrome vs dehydration vs electrolyte derangement vs atypical ACS vs anemia vs other  Co-morbidities that complicate the patient evaluation: none  I ordered and personally interpreted labs.  The pertinent results include:  trop 4, mg, K wnl, Cr normal, Hgb normal, WBC normal.  Covid/flu negative.  I ordered imaging studies including ECG, CT head I independently visualized and interpreted imaging which showed no acute CVA.  ECG per my interpretation shows NSR without significant changes from prior tracings, no acute ischemic changes. I agree with the radiologist interpretation  The patient was maintained on a cardiac monitor.  I personally viewed and interpreted the cardiac monitored which showed an underlying rhythm of: NSR  Test Considered:  - Clinically unlikely to be PE, aortic dissection, pneumothorax  After the interventions noted above, I reevaluated the patient and found that they have: improved   Dispostion:  After consideration of the diagnostic results and the patients response to treatment, I feel that the patent would benefit from outpatient PCP follow up.  Clinical Course as of 10/08/21 0615  Sat Oct 07, 2021  1405 CTH unremarkable.  Will draw trop now [MT]  1538 Patient remains asymptomatic at this time.  We are awaiting his troponin level.  The rest of his work-up has been unremarkable.  I explained to him that I suspect he may still have a viral syndrome given his symptoms the past 3 days, I have a lower suspicion for PE, aortic dissection or stroke based on this presentation.  Doubt sepsis.  I would advise that he keep himself hydrated, rest for the next 2 days at home, and return precautions were discussed.  He verbalized  understanding [MT]    Clinical Course User Index [MT] Caia Lofaro, Kermit Balo, MD            Final Clinical Impression(s) / ED Diagnoses Final diagnoses:  Weakness    Rx / DC Orders ED Discharge Orders     None         Ashlay Altieri, Kermit Balo, MD 10/08/21 [phone]

## 2021-10-07 NOTE — ED Provider Triage Note (Signed)
Emergency Medicine Provider Triage Evaluation Note  Derek Newton , a 36 y.o. male  was evaluated in triage.  Pt complains of left-sided numbness and tingling.  States that same began this morning when he was moving tires around 9:30 AM.  Symptoms are present and throughout entire left side from face to feet.  No history of heart problems or stroke.  Review of Systems  Positive:  Negative: See above  Physical Exam  BP (!) 142/89    Pulse (!) 118    Temp 97.8 F (36.6 C)    Resp 20    Ht 5\' 7"  (1.702 m)    Wt 88.5 kg    SpO2 99%    BMI 30.54 kg/m  Gen:   Awake, no distress   Resp:  Normal effort  MSK:   Moves extremities without difficulty  Other:  Neurologically intact, alert and oriented, states that he has sensation changes on the left side of his face, arms, and legs.  He is ambulatory without difficulty.   Medical Decision Making  Medically screening exam initiated at 12:43 PM.  Appropriate orders placed.  Derek Newton was informed that the remainder of the evaluation will be completed by another provider, this initial triage assessment does not replace that evaluation, and the importance of remaining in the ED until their evaluation is complete.  Symptoms are unilateral throughout on left side including left face, code stroke not called.  Stroke order set initiated   Derek Newton 10/07/21 1246

## 2021-10-07 NOTE — ED Triage Notes (Signed)
Pt reports he was moving tires this morning. States he became jittery all over and weak. Equal grip strengths. Denies CP.

## 2021-10-07 NOTE — Discharge Instructions (Signed)
Please read plenty of water to keep yourself hydrated at home.  I recommend resting for the next 2 days.    Your COVID and flu test were negative.  However, it still possible that you have another virus that is causing you to feel this week.  Most viruses last about 5 with symptoms.  We do not see any signs of emergency medical conditions based on your work-up at this time, including CT scan of the head, blood test, and EKG of your chest.  However it is still important that you follow-up with your primary care doctor this week to ensure that you are getting better.  If you have new symptoms, such as chest pain or pressure, especially associated with left arm heaviness or weakness, you need to come back to the ER.

## 2021-10-07 NOTE — ED Notes (Signed)
Patient transported to CT 

## 2021-11-21 ENCOUNTER — Ambulatory Visit: Payer: Self-pay | Admitting: Family Medicine

## 2022-10-22 ENCOUNTER — Encounter (HOSPITAL_COMMUNITY): Payer: Self-pay

## 2022-10-22 ENCOUNTER — Ambulatory Visit (HOSPITAL_COMMUNITY)
Admission: EM | Admit: 2022-10-22 | Discharge: 2022-10-22 | Disposition: A | Discharge status: Home / Self Care | Payer: Self-pay | Attending: Internal Medicine | Admitting: Internal Medicine

## 2022-10-22 ENCOUNTER — Ambulatory Visit (INDEPENDENT_AMBULATORY_CARE_PROVIDER_SITE_OTHER): Payer: Self-pay

## 2022-10-22 DIAGNOSIS — J069 Acute upper respiratory infection, unspecified: Secondary | ICD-10-CM | POA: Insufficient documentation

## 2022-10-22 DIAGNOSIS — R059 Cough, unspecified: Secondary | ICD-10-CM

## 2022-10-22 DIAGNOSIS — R509 Fever, unspecified: Secondary | ICD-10-CM

## 2022-10-22 DIAGNOSIS — Z1152 Encounter for screening for COVID-19: Secondary | ICD-10-CM | POA: Insufficient documentation

## 2022-10-22 LAB — CBC WITH DIFFERENTIAL/PLATELET
Abs Immature Granulocytes: 0.01 10*3/uL (ref 0.00–0.07)
Basophils Absolute: 0 10*3/uL (ref 0.0–0.1)
Basophils Relative: 0 %
Eosinophils Absolute: 0 10*3/uL (ref 0.0–0.5)
Eosinophils Relative: 0 %
HCT: 43.7 % (ref 39.0–52.0)
Hemoglobin: 15.6 g/dL (ref 13.0–17.0)
Immature Granulocytes: 0 %
Lymphocytes Relative: 29 %
Lymphs Abs: 1.3 10*3/uL (ref 0.7–4.0)
MCH: 30.1 pg (ref 26.0–34.0)
MCHC: 35.7 g/dL (ref 30.0–36.0)
MCV: 84.2 fL (ref 80.0–100.0)
Monocytes Absolute: 0.4 10*3/uL (ref 0.1–1.0)
Monocytes Relative: 8 %
Neutro Abs: 2.8 10*3/uL (ref 1.7–7.7)
Neutrophils Relative %: 63 %
Platelets: 121 10*3/uL — ABNORMAL LOW (ref 150–400)
RBC: 5.19 MIL/uL (ref 4.22–5.81)
RDW: 11.8 % (ref 11.5–15.5)
WBC: 4.5 10*3/uL (ref 4.0–10.5)
nRBC: 0 % (ref 0.0–0.2)

## 2022-10-22 LAB — BASIC METABOLIC PANEL
Anion gap: 9 (ref 5–15)
BUN: 12 mg/dL (ref 6–20)
CO2: 24 mmol/L (ref 22–32)
Calcium: 8.8 mg/dL — ABNORMAL LOW (ref 8.9–10.3)
Chloride: 101 mmol/L (ref 98–111)
Creatinine, Ser: 0.82 mg/dL (ref 0.61–1.24)
GFR, Estimated: >60 mL/min (ref >60)
Glucose, Bld: 105 mg/dL — ABNORMAL HIGH (ref 70–99)
Potassium: 3.3 mmol/L — ABNORMAL LOW (ref 3.5–5.1)
Sodium: 134 mmol/L — ABNORMAL LOW (ref 135–145)

## 2022-10-22 LAB — SARS CORONAVIRUS 2 (TAT 6-24 HRS): SARS Coronavirus 2: NEGATIVE

## 2022-10-22 MED ORDER — ONDANSETRON 4 MG PO TBDP: 4.0000 mg | ORAL_TABLET | Freq: Three times a day (TID) | ORAL | 0 refills | Status: AC | PRN

## 2022-10-22 MED ORDER — BENZONATATE 100 MG PO CAPS: 100.0000 mg | ORAL_CAPSULE | Freq: Three times a day (TID) | ORAL | 0 refills | Status: DC

## 2022-10-22 MED ORDER — IBUPROFEN 600 MG PO TABS: 600.0000 mg | ORAL_TABLET | Freq: Four times a day (QID) | ORAL | 0 refills | Status: DC | PRN

## 2022-10-22 MED ORDER — GUAIFENESIN ER 600 MG PO TB12: 600.0000 mg | ORAL_TABLET | Freq: Two times a day (BID) | ORAL | 0 refills | Status: AC

## 2022-10-22 MED ORDER — ALBUTEROL SULFATE HFA 108 (90 BASE) MCG/ACT IN AERS: 1.0000 | INHALATION_SPRAY | Freq: Four times a day (QID) | RESPIRATORY_TRACT | 0 refills | Status: DC | PRN

## 2022-10-22 NOTE — Discharge Instructions (Addendum)
Please increase oral fluid intake Take medications as prescribed Chest x-ray is negative for pneumonia Will call you with recommendations if labs are abnormal Please return to urgent care if symptoms worsen.

## 2022-10-22 NOTE — ED Provider Notes (Signed)
[facility]    CSN: [id number] Arrival date & time: 10/22/22  1007      History   Chief Complaint No chief complaint on file.   HPI Derek Newton is a 37 y.o. male comes to the urgent care with 5-day history of sore throat, generalized bodyaches, fever of 104 Fahrenheit, initially nonproductive cough and generalized weakness.  Patient's symptoms started fairly abruptly and has been persistent.  Fever is about 100 F at this time.  He denies any shortness of breath.  Recently his cough is blood-tinged.  He is producing yellowish sputum.  He has some chest tightness and wheezing especially at night.  He endorses fatigue.  No nausea, vomiting or diarrhea.  Patient's wife has similar symptoms. HPI  History reviewed. No pertinent past medical history.  There are no problems to display for this patient.   History reviewed. No pertinent surgical history.     Home Medications    Prior to Admission medications   Medication Sig Start Date End Date Taking? Authorizing Provider  albuterol (VENTOLIN HFA) 108 (90 Base) MCG/ACT inhaler Inhale 1-2 puffs into the lungs every 6 (six) hours as needed for wheezing or shortness of breath. 10/22/22  Yes Eryn Krejci, Myrene Galas, MD  benzonatate (TESSALON) 100 MG capsule Take 1 capsule (100 mg total) by mouth every 8 (eight) hours. 10/22/22  Yes Ferrin Liebig, Myrene Galas, MD  guaiFENesin (MUCINEX) 600 MG 12 hr tablet Take 1 tablet (600 mg total) by mouth 2 (two) times daily for 10 days. 10/22/22 11/01/22 Yes Charman Blasco, Myrene Galas, MD  ibuprofen (ADVIL) 600 MG tablet Take 1 tablet (600 mg total) by mouth every 6 (six) hours as needed. 10/22/22  Yes Atsushi Yom, Myrene Galas, MD  ondansetron (ZOFRAN-ODT) 4 MG disintegrating tablet Take 1 tablet (4 mg total) by mouth every 8 (eight) hours as needed for nausea or vomiting. 10/22/22  Yes Amiee Wiley, Myrene Galas, MD  acyclovir (ZOVIRAX) 800 MG tablet Take 5 tablets a day by taking one every 4 hours while awake Patient not  taking: Reported on 07/17/2015 05/20/12   Leandrew Koyanagi, MD  desloratadine (CLARINEX) 5 MG tablet Take 1 tablet (5 mg total) by mouth daily. 09/29/11 09/28/12  Palumbo, April, MD    Family History History reviewed. No pertinent family history.  Social History Social History   Tobacco Use   Smoking status: Former    Types: Cigarettes    Quit date: 10/20/2005    Years since quitting: 17.0   Smokeless tobacco: Never  Substance Use Topics   Alcohol use: No   Drug use: No     Allergies   Watermelon flavor   Review of Systems Review of Systems  Constitutional:  Positive for chills, fatigue and fever.  HENT:  Positive for congestion and sore throat.   Respiratory:  Positive for cough and chest tightness. Negative for shortness of breath and wheezing.   Cardiovascular: Negative.   Gastrointestinal: Negative.   Genitourinary: Negative.   Musculoskeletal:  Positive for arthralgias and myalgias.  Neurological:  Positive for headaches.     Physical Exam Triage Vital Signs ED Triage Vitals  Enc Vitals Group     BP 10/22/22 1143 126/83     Pulse Rate 10/22/22 1143 (!) 113     Resp 10/22/22 1143 16     Temp 10/22/22 1143 98.8 F (37.1 C)     Temp Source 10/22/22 1143 Oral     SpO2 10/22/22 1143 96 %     Weight --  Height --      Head Circumference --      Peak Flow --      Pain Score 10/22/22 1141 0     Pain Loc --      Pain Edu? --      Excl. in GC? --    No data found.  Updated Vital Signs BP 126/83 (BP Location: Left Arm)   Pulse (!) 113   Temp 98.8 F (37.1 C) (Oral)   Resp 16   SpO2 96%   Visual Acuity Right Eye Distance:   Left Eye Distance:   Bilateral Distance:    Right Eye Near:   Left Eye Near:    Bilateral Near:     Physical Exam Vitals and nursing note reviewed.  Constitutional:      General: He is not in acute distress.    Appearance: He is not ill-appearing.  HENT:     Right Ear: Tympanic membrane normal.     Left Ear:  Tympanic membrane normal.     Nose: Congestion present. No rhinorrhea.     Mouth/Throat:     Pharynx: Posterior oropharyngeal erythema present.  Cardiovascular:     Rate and Rhythm: Normal rate and regular rhythm.     Pulses: Normal pulses.     Heart sounds: Normal heart sounds.  Pulmonary:     Effort: Pulmonary effort is normal.     Breath sounds: Normal breath sounds.  Abdominal:     General: Bowel sounds are normal.     Palpations: Abdomen is soft.  Musculoskeletal:        General: Normal range of motion.  Neurological:     Mental Status: He is alert.      UC Treatments / Results  Labs (all labs ordered are listed, but only abnormal results are displayed) Labs Reviewed  SARS CORONAVIRUS 2 (TAT 6-24 HRS)  CBC WITH DIFFERENTIAL/PLATELET  BASIC METABOLIC PANEL    EKG   Radiology DG Chest 2 View  Result Date: 10/22/2022 CLINICAL DATA:  productive cough with fever EXAM: CHEST - 2 VIEW COMPARISON:  09/09/2020 FINDINGS: Cardiac silhouette is unremarkable. No pneumothorax or pleural effusion. The lungs are clear. The visualized skeletal structures are unremarkable. IMPRESSION: No acute cardiopulmonary process. Electronically Signed   By: Layla Maw M.D.   On: 10/22/2022 12:11    Procedures Procedures (including critical care time)  Medications Ordered in UC Medications - No data to display  Initial Impression / Assessment and Plan / UC Course  I have reviewed the triage vital signs and the nursing notes.  Pertinent labs & imaging results that were available during my care of the patient were reviewed by me and considered in my medical decision making (see chart for details).     1.  Viral URI with cough: Chest x-ray is negative for acute lung process CBC, BMP Increase oral fluid intake Tessalon Perles as needed for cough Zofran as needed for nausea/vomiting Mucinex Ibuprofen as needed for pain and/or fever Albuterol inhaler Will call patient with  recommendations if labs are abnormal. Final Clinical Impressions(s) / UC Diagnoses   Final diagnoses:  Viral URI with cough     Discharge Instructions      Please increase oral fluid intake Take medications as prescribed Chest x-ray is negative for pneumonia Will call you with recommendations if labs are abnormal Please return to urgent care if symptoms worsen.   ED Prescriptions     Medication Sig Dispense Auth. Provider  benzonatate (TESSALON) 100 MG capsule Take 1 capsule (100 mg total) by mouth every 8 (eight) hours. 21 capsule Timara Loma, Myrene Galas, MD   ibuprofen (ADVIL) 600 MG tablet Take 1 tablet (600 mg total) by mouth every 6 (six) hours as needed. 30 tablet Lovetta Condie, Myrene Galas, MD   guaiFENesin (MUCINEX) 600 MG 12 hr tablet Take 1 tablet (600 mg total) by mouth 2 (two) times daily for 10 days. 20 tablet Yarelin Reichardt, Myrene Galas, MD   ondansetron (ZOFRAN-ODT) 4 MG disintegrating tablet Take 1 tablet (4 mg total) by mouth every 8 (eight) hours as needed for nausea or vomiting. 20 tablet Moni Rothrock, Myrene Galas, MD   albuterol (VENTOLIN HFA) 108 (90 Base) MCG/ACT inhaler Inhale 1-2 puffs into the lungs every 6 (six) hours as needed for wheezing or shortness of breath. 6.7 g Hallel Denherder, Myrene Galas, MD      PDMP not reviewed this encounter.   Chase Picket, MD 10/22/22 1315

## 2022-10-22 NOTE — ED Triage Notes (Signed)
Pt is here for sneezing, cough, diarrhea, nausea  headache, fever, body aches and chills mostly at night ,loss of appetite, fatigue x 5days

## 2023-06-28 IMAGING — CT CT HEAD W/O CM
4 series · 17 of 47 positions shown, 19 images · non-contrast
Comparison: None.

CLINICAL DATA: Left-sided numbness and tingling.

EXAM:
CT HEAD WITHOUT CONTRAST
TECHNIQUE: Contiguous axial images were obtained from the base of the skull
through the vertex without intravenous contrast.

[Series 3: head without · axial · non-contrast · 0.47mm/px · z∈[-52,+68]mm · 7 of 34 slices shown, 9 images]
[im 5/34  brain]
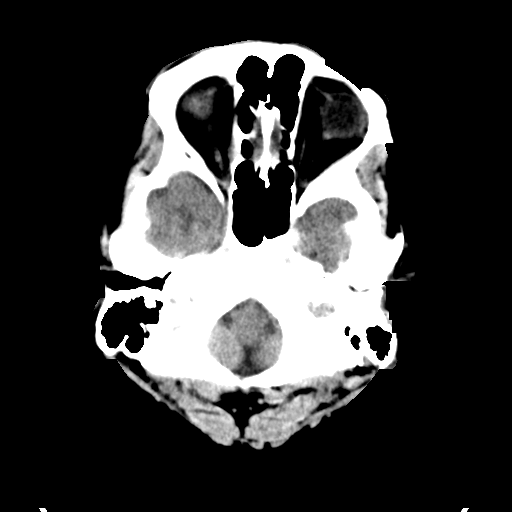
[im 5/34  bone]
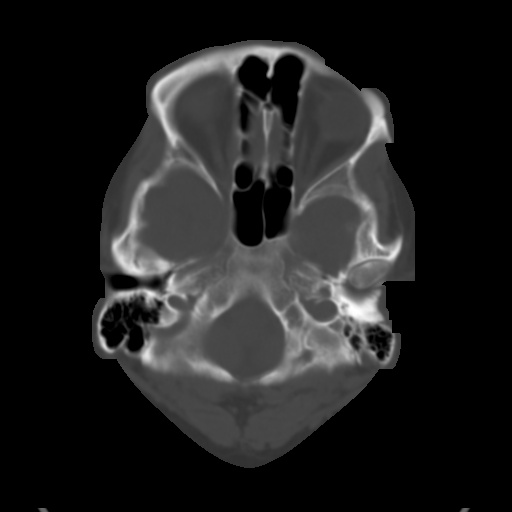
[im 9/34  brain]
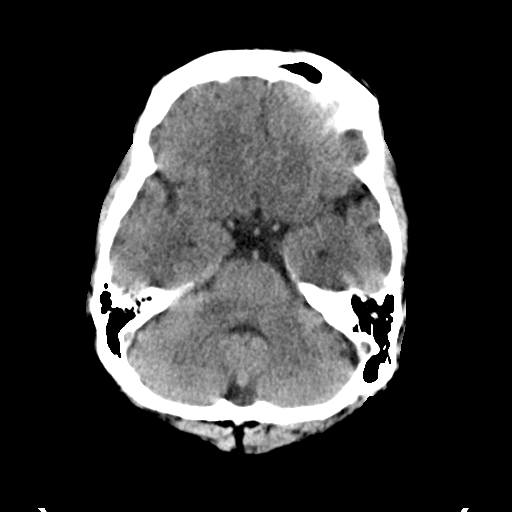
[im 13/34  brain]
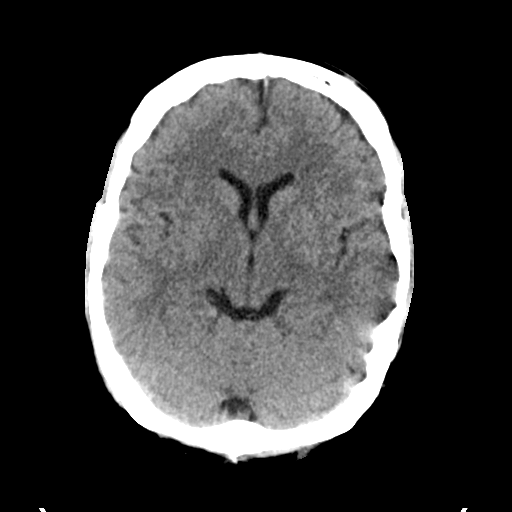
[im 17/34  brain]
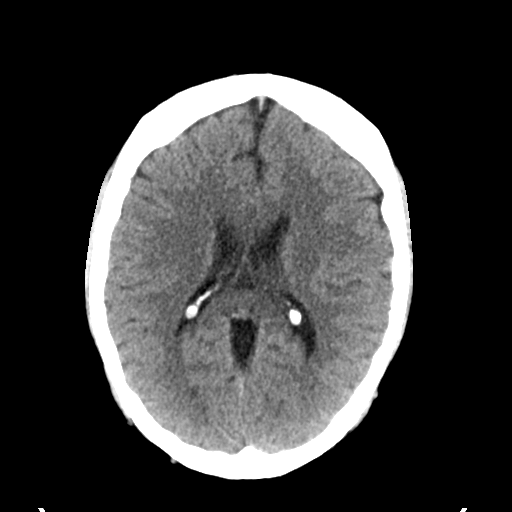
[im 21/34  brain]
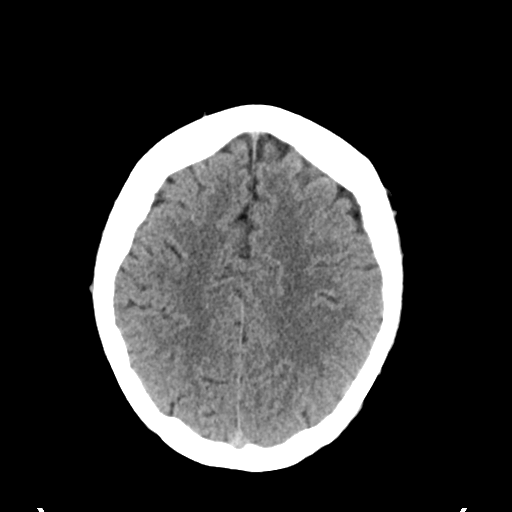
[im 21/34  bone]
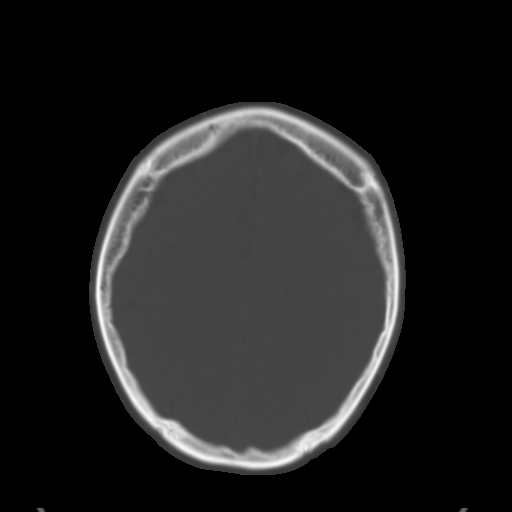
[im 25/34  brain]
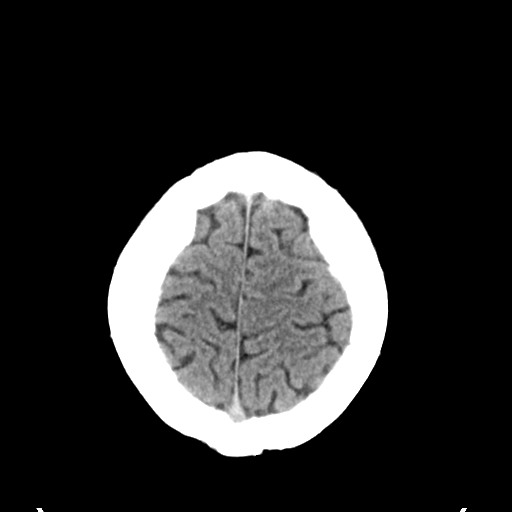
[im 29/34  brain]
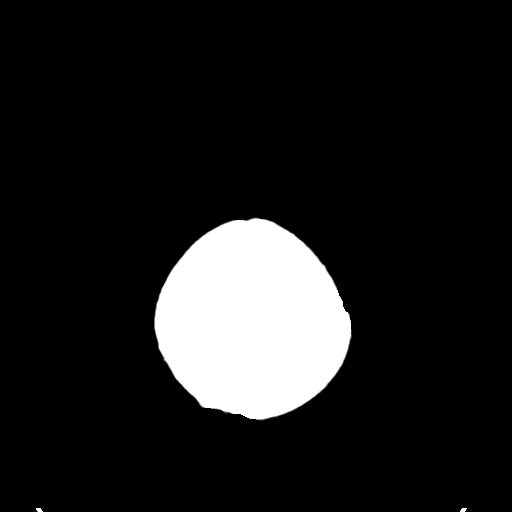

[Series 4: ax head bone · axial · 0.43mm/px · z∈[-46,+7]mm · 4 of 83 slices shown]
[im 9/83  bone]
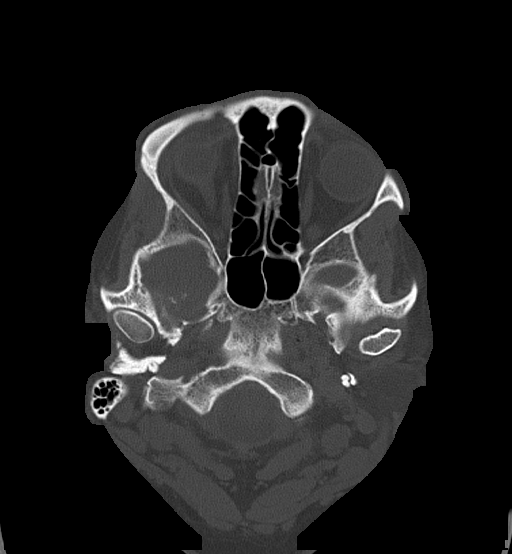
[im 17/83  bone]
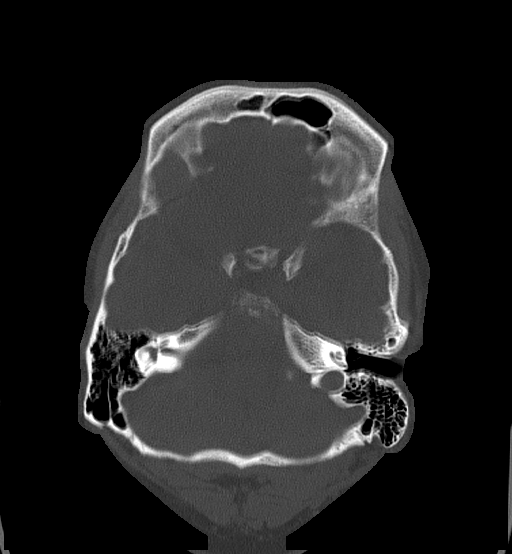
[im 25/83  bone]
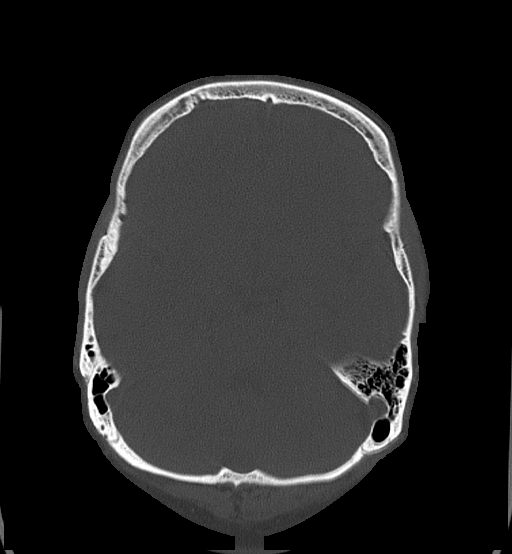
[im 37/83  bone]
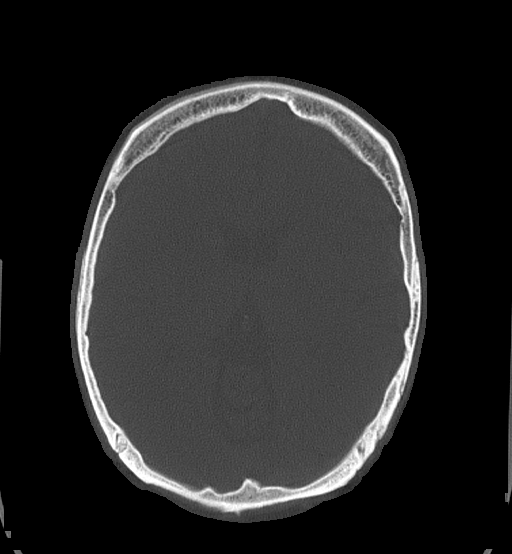

[Series 5: head without cor · coronal · non-contrast · 0.33mm/px · 3 of 64 slices shown]
[im 22/64  brain]
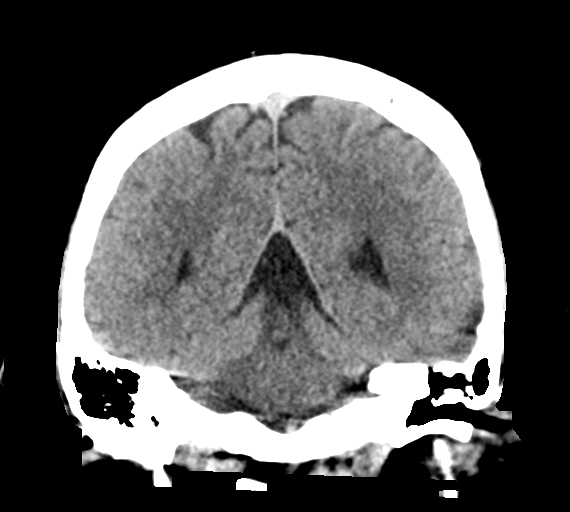
[im 29/64  brain]
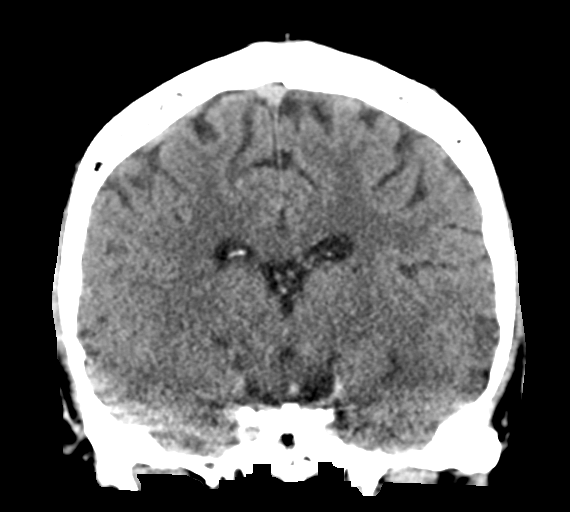
[im 36/64  brain]
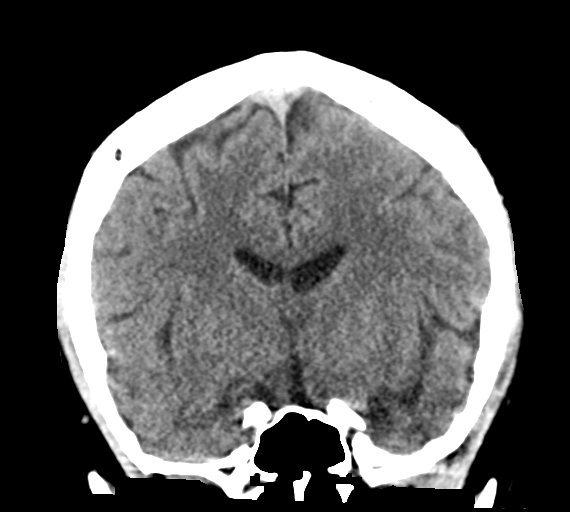

[Series 6: head without sag · sagittal · non-contrast · 0.36mm/px · 3 of 52 slices shown]
[im 18/52  brain]
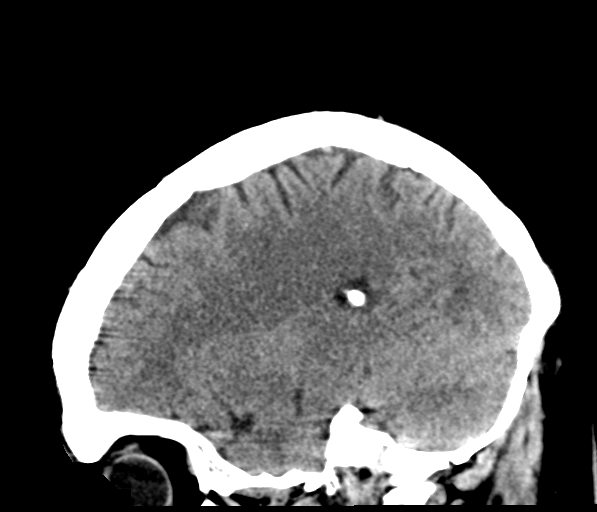
[im 26/52  brain]
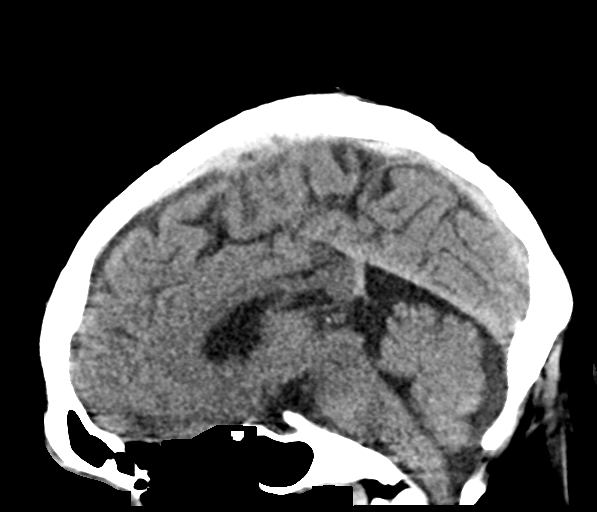
[im 35/52  brain]
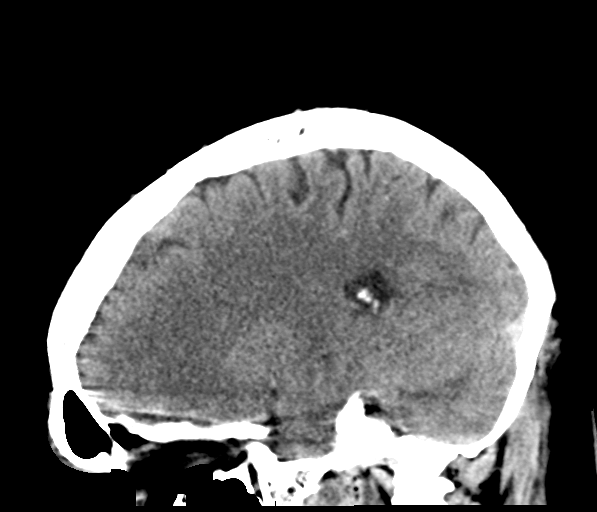

[17 of 47 positions shown; findings below may reference images not displayed]

FINDINGS: Brain: No evidence of acute infarction, hemorrhage, hydrocephalus,
extra-axial collection or mass lesion/mass effect.

Vascular: No hyperdense vessel or unexpected calcification.

Skull: Normal. Negative for fracture or focal lesion.

Sinuses/Orbits: No acute finding.

Other: None.
IMPRESSION: Normal noncontrast head CT.

## 2023-09-29 ENCOUNTER — Ambulatory Visit
Admission: RE | Admit: 2023-09-29 | Discharge: 2023-09-29 | Disposition: A | Discharge status: Home / Self Care | Payer: Self-pay | Source: Ambulatory Visit | Attending: Internal Medicine | Admitting: Internal Medicine

## 2023-09-29 VITALS — BP 131/88 | HR 81 | Temp 97.7°F | Resp 16

## 2023-09-29 DIAGNOSIS — J209 Acute bronchitis, unspecified: Secondary | ICD-10-CM

## 2023-09-29 MED ORDER — PROMETHAZINE-DM 6.25-15 MG/5ML PO SYRP: 5.0000 mL | ORAL_SOLUTION | Freq: Four times a day (QID) | ORAL | 0 refills | Status: DC | PRN

## 2023-09-29 MED ORDER — ALBUTEROL SULFATE HFA 108 (90 BASE) MCG/ACT IN AERS: 1.0000 | INHALATION_SPRAY | Freq: Four times a day (QID) | RESPIRATORY_TRACT | 0 refills | Status: AC | PRN

## 2023-09-29 MED ORDER — AZITHROMYCIN 250 MG PO TABS: ORAL_TABLET | ORAL | 0 refills | Status: DC

## 2023-09-29 NOTE — ED Triage Notes (Signed)
Pt reports chest tightness, nasal congestion and intermittent throat tightness x 10 days. States when swallows can hear a "popping" noise. Has been taking Dayquil,Nyquil and Tylenol with no relief.

## 2023-09-29 NOTE — Discharge Instructions (Signed)
A prescription was sent for Zithromax. This is an antibiotic used to treat upper respiratory infections. Take as directed. Return in 3 to 4 days if no improvement. I have also sent you a prescription for an inhaler and cough medicine. Take the medicine at bedtime when you are not driving or working because it may cause drowsiness.   It is very important for you to pay attention to any new symptoms or worsening of your current condition. Please go directly to the Emergency Department immediately should you begin to have any of the following symptoms: shortness of breath, chest pain or difficulty breathing.

## 2023-09-29 NOTE — ED Provider Notes (Signed)
[facility]  Note:  This document was prepared using Dragon voice recognition software and may include unintentional dictation errors.  MRN: [id number] DOB: 04-Dec-1985 DATE: 09/29/23   Subjective:  Chief Complaint:  Chief Complaint  Patient presents with   Nasal Congestion    Headache and dry throat - Entered by patient   Sore Throat     HPI: Derek Newton is a 37 y.o. male presenting for cough for the past week and a half. Patient states he started with a scratchy throat over a week ago and has since developed a persistent cough. He states the cough became worse yesterday and he thought he was going to call EMS last night. He states he kept coughing. Reports a dry cough. No known sick contacts. He has tried OTC Dayquil and Nyquil with no relief. Denies fever, nausea/vomiting, abdominal pain, sore throat, otalgia. Endorses cough, congestion. Presents NAD.  Prior to Admission medications   Medication Sig Start Date End Date Taking? Authorizing Provider  acyclovir (ZOVIRAX) 800 MG tablet Take 5 tablets a day by taking one every 4 hours while awake Patient not taking: Reported on 07/17/2015 05/20/12   Tonye Pearson, MD  albuterol (VENTOLIN HFA) 108 (90 Base) MCG/ACT inhaler Inhale 1-2 puffs into the lungs every 6 (six) hours as needed for wheezing or shortness of breath. 09/29/23   Emory Leaver P, PA-C  desloratadine (CLARINEX) 5 MG tablet Take 1 tablet (5 mg total) by mouth daily. 09/29/11 09/28/12  Palumbo, April, MD  ibuprofen (ADVIL) 600 MG tablet Take 1 tablet (600 mg total) by mouth every 6 (six) hours as needed. 10/22/22   Merrilee Jansky, MD  ondansetron (ZOFRAN-ODT) 4 MG disintegrating tablet Take 1 tablet (4 mg total) by mouth every 8 (eight) hours as needed for nausea or vomiting. 10/22/22   Lamptey, Britta Mccreedy, MD     Allergies  Allergen Reactions   Watermelon Flavoring Agent (Non-Screening) Swelling    Throat     History:   History reviewed. No  pertinent past medical history.   History reviewed. No pertinent surgical history.  Family History  Problem Relation Age of Onset   Diabetes Mother    Diabetes Father     Social History   Tobacco Use   Smoking status: Former    Current packs/day: 0.00    Types: Cigarettes    Quit date: 10/20/2005    Years since quitting: 17.9   Smokeless tobacco: Never  Substance Use Topics   Alcohol use: No   Drug use: No    Review of Systems  Constitutional:  Negative for fever.  HENT:  Positive for congestion and rhinorrhea. Negative for ear pain and sore throat.   Respiratory:  Positive for cough.   Gastrointestinal:  Negative for abdominal pain, nausea and vomiting.     Objective:   Vitals: BP 131/88 (BP Location: Right Arm)   Pulse 81   Temp 97.7 F (36.5 C) (Oral)   Resp 16   SpO2 95%   Physical Exam Constitutional:      General: He is not in acute distress.    Appearance: Normal appearance. He is well-developed and normal weight. He is not ill-appearing or toxic-appearing.  HENT:     Head: Normocephalic and atraumatic.     Right Ear: Ear canal normal. A middle ear effusion is present.     Left Ear: Ear canal normal. A middle ear effusion is present.     Mouth/Throat:     Pharynx:  Oropharynx is clear. Uvula midline. No pharyngeal swelling, oropharyngeal exudate or posterior oropharyngeal erythema.     Tonsils: No tonsillar exudate or tonsillar abscesses.  Cardiovascular:     Rate and Rhythm: Normal rate and regular rhythm.     Heart sounds: Normal heart sounds.  Pulmonary:     Effort: Pulmonary effort is normal.     Breath sounds: Normal breath sounds.     Comments: Clear to auscultation bilaterally  Abdominal:     General: Bowel sounds are normal.     Palpations: Abdomen is soft.     Tenderness: There is no abdominal tenderness.  Skin:    General: Skin is warm and dry.  Neurological:     General: No focal deficit present.     Mental Status: He is alert.   Psychiatric:        Mood and Affect: Mood and affect normal.     Results:  Labs: No results found for this or any previous visit (from the past 24 hours).  Radiology: No results found.   UC Course/Treatments:  Procedures: Procedures   Medications Ordered in UC: Medications - No data to display   Assessment and Plan :     ICD-10-CM   1. Acute bronchitis, unspecified organism  J20.9      Acute Bronchitis, unspecified organism Afebrile, nontoxic-appearing, NAD. VSS. DDX includes but not limited to: COVID, flu, bronchitis, pneumonia, viral URI COVID and flu not ordered given length of symptoms. Given worsening cough, zithromax 250mg  as directed was prescribed. Albuterol inhaler 2 puffs every 6 hours PRN for shortness of breath or wheezing and Promethazine-DM QID PRN was prescribed for cough. Strict ED precautions were given and patient verbalized understanding.   ED Discharge Orders          Ordered    albuterol (VENTOLIN HFA) 108 (90 Base) MCG/ACT inhaler  Every 6 hours PRN        09/29/23 1015    azithromycin (ZITHROMAX Z-PAK) 250 MG tablet        09/29/23 1015    promethazine-dextromethorphan (PROMETHAZINE-DM) 6.25-15 MG/5ML syrup  4 times daily PRN        09/29/23 1015             PDMP not reviewed this encounter.     Shira Bobst P, PA-C 09/29/23 1022

## 2023-11-30 ENCOUNTER — Other Ambulatory Visit: Payer: Self-pay

## 2023-11-30 ENCOUNTER — Encounter (HOSPITAL_COMMUNITY): Payer: Self-pay | Admitting: *Deleted

## 2023-11-30 ENCOUNTER — Ambulatory Visit (HOSPITAL_COMMUNITY)
Admission: EM | Admit: 2023-11-30 | Discharge: 2023-11-30 | Disposition: A | Discharge status: Home / Self Care | Payer: Self-pay | Attending: Family Medicine | Admitting: Family Medicine

## 2023-11-30 DIAGNOSIS — R079 Chest pain, unspecified: Secondary | ICD-10-CM

## 2023-11-30 DIAGNOSIS — R0602 Shortness of breath: Secondary | ICD-10-CM

## 2023-11-30 DIAGNOSIS — F419 Anxiety disorder, unspecified: Secondary | ICD-10-CM

## 2023-11-30 MED ORDER — HYDROXYZINE HCL 10 MG PO TABS: 10.0000 mg | ORAL_TABLET | Freq: Three times a day (TID) | ORAL | 0 refills | Status: AC | PRN

## 2023-11-30 NOTE — ED Triage Notes (Signed)
 PT reports he has had intermittent SHOB and CP since Tuesday. Pt denies asthma but does have anxiety with panic attacks.

## 2023-11-30 NOTE — Discharge Instructions (Signed)
 I have prescribed hydroxyzine that you can take every 8 hours as needed for anxiety. This can make you drowsy so do not drive, work, or drink when taking.   If your symptoms persist you can return here or follow-up with [facility] that I have attached on your paperwork for further management. I have also attached the [facility] clinic schedule to the back or your paperwork and they can also provider further management.   If you develop worsening chest pain or shortness of breath please seek immediate medical treatment in the ER.

## 2023-11-30 NOTE — ED Provider Notes (Signed)
 [facility]    CSN: [id number] Arrival date & time: 11/30/23  1507      History   Chief Complaint Chief Complaint  Patient presents with   Shortness of Breath    HPI Derek Newton is a 38 y.o. male.   Patient presents with intermittent shortness of breath and chest tightness since 2/25. Patient also endorses palpitations at times when he has episodes of chest tightness and shortness of breath. Denies dizziness, lightheadedness, and syncope.   Patient states that he has been see a couple times in the ER  for same and was diagnosed with anxiety with panic attacks, and states that his symptoms are similar to when that has occurred. Patient denies taking anything for anxiety.      Shortness of Breath   History reviewed. No pertinent past medical history.  There are no active problems to display for this patient.   History reviewed. No pertinent surgical history.     Home Medications    Prior to Admission medications   Medication Sig Start Date End Date Taking? Authorizing Provider  albuterol (VENTOLIN HFA) 108 (90 Base) MCG/ACT inhaler Inhale 1-2 puffs into the lungs every 6 (six) hours as needed for wheezing or shortness of breath. 09/29/23  Yes Hermanns, Ashlee P, PA-C  hydrOXYzine (ATARAX) 10 MG tablet Take 1 tablet (10 mg total) by mouth 3 (three) times daily as needed for anxiety. 11/30/23  Yes Wynonia Lawman A, NP  acyclovir (ZOVIRAX) 800 MG tablet Take 5 tablets a day by taking one every 4 hours while awake Patient not taking: Reported on 07/17/2015 05/20/12   Tonye Pearson, MD  desloratadine (CLARINEX) 5 MG tablet Take 1 tablet (5 mg total) by mouth daily. 09/29/11 09/28/12  Palumbo, April, MD  ondansetron (ZOFRAN-ODT) 4 MG disintegrating tablet Take 1 tablet (4 mg total) by mouth every 8 (eight) hours as needed for nausea or vomiting. 10/22/22   Lamptey, Britta Mccreedy, MD    Family History Family History  Problem Relation Age of Onset    Diabetes Mother    Diabetes Father     Social History Social History   Tobacco Use   Smoking status: Former    Current packs/day: 0.00    Types: Cigarettes    Quit date: 10/20/2005    Years since quitting: 18.1   Smokeless tobacco: Never  Substance Use Topics   Alcohol use: No   Drug use: No     Allergies   Watermelon flavoring agent (non-screening)   Review of Systems Review of Systems  Respiratory:  Positive for shortness of breath.    Per HPI  Physical Exam Triage Vital Signs ED Triage Vitals  Encounter Vitals Group     BP 11/30/23 1516 (!) 138/93     Systolic BP Percentile --      Diastolic BP Percentile --      Pulse Rate 11/30/23 1516 98     Resp 11/30/23 1516 20     Temp 11/30/23 1516 98 F (36.7 C)     Temp src --      SpO2 11/30/23 1516 98 %     Weight --      Height --      Head Circumference --      Peak Flow --      Pain Score 11/30/23 1513 6     Pain Loc --      Pain Education --      Exclude from Growth Chart --  No data found.  Updated Vital Signs BP (!) 138/93   Pulse 98   Temp 98 F (36.7 C)   Resp 20   SpO2 98%   Visual Acuity Right Eye Distance:   Left Eye Distance:   Bilateral Distance:    Right Eye Near:   Left Eye Near:    Bilateral Near:     Physical Exam Vitals and nursing note reviewed.  Constitutional:      General: He is awake. He is not in acute distress.    Appearance: Normal appearance. He is well-developed and well-groomed. He is not ill-appearing.  Cardiovascular:     Rate and Rhythm: Normal rate and regular rhythm.  Pulmonary:     Effort: Pulmonary effort is normal.     Breath sounds: Normal breath sounds.  Skin:    General: Skin is warm and dry.  Neurological:     General: No focal deficit present.     Mental Status: He is alert and oriented to person, place, and time. Mental status is at baseline.  Psychiatric:        Behavior: Behavior is cooperative.      UC Treatments / Results   Labs (all labs ordered are listed, but only abnormal results are displayed) Labs Reviewed - No data to display  EKG   Radiology No results found.  Procedures Procedures (including critical care time)  Medications Ordered in UC Medications - No data to display  Initial Impression / Assessment and Plan / UC Course  I have reviewed the triage vital signs and the nursing notes.  Pertinent labs & imaging results that were available during my care of the patient were reviewed by me and considered in my medical decision making (see chart for details).     No significant findings on assessment. EKG revealed normal sinus rhythm without ST elevation, depression, or acute cardiac findings.  Prescribed hydroxyzine to take as needed for anxiety-like symptoms. Discussed follow-up, return, and strict ER precautions.  Final Clinical Impressions(s) / UC Diagnoses   Final diagnoses:  Shortness of breath  Chest pain, unspecified type  Anxiety     Discharge Instructions      I have prescribed hydroxyzine that you can take every 8 hours as needed for anxiety. This can make you drowsy so do not drive, work, or drink when taking.   If your symptoms persist you can return here or follow-up with [facility] that I have attached on your paperwork for further management. I have also attached the [facility] clinic schedule to the back or your paperwork and they can also provider further management.   If you develop worsening chest pain or shortness of breath please seek immediate medical treatment in the ER.     ED Prescriptions     Medication Sig Dispense Auth. Provider   hydrOXYzine (ATARAX) 10 MG tablet Take 1 tablet (10 mg total) by mouth 3 (three) times daily as needed for anxiety. 30 tablet Wynonia Lawman A, NP      PDMP not reviewed this encounter.   Wynonia Lawman A, NP 11/30/23 [phone]

## 2024-02-29 ENCOUNTER — Encounter (HOSPITAL_COMMUNITY): Payer: Self-pay

## 2024-02-29 ENCOUNTER — Ambulatory Visit (HOSPITAL_COMMUNITY)
Admission: RE | Admit: 2024-02-29 | Discharge: 2024-02-29 | Disposition: A | Discharge status: Home / Self Care | Payer: Self-pay | Source: Ambulatory Visit | Attending: Emergency Medicine | Admitting: Emergency Medicine

## 2024-02-29 VITALS — BP 135/91 | HR 98 | Temp 98.0°F | Resp 16

## 2024-02-29 DIAGNOSIS — K219 Gastro-esophageal reflux disease without esophagitis: Secondary | ICD-10-CM | POA: Insufficient documentation

## 2024-02-29 DIAGNOSIS — R3129 Other microscopic hematuria: Secondary | ICD-10-CM | POA: Insufficient documentation

## 2024-02-29 DIAGNOSIS — M546 Pain in thoracic spine: Secondary | ICD-10-CM | POA: Insufficient documentation

## 2024-02-29 HISTORY — DX: Anxiety disorder, unspecified: F41.9

## 2024-02-29 LAB — POCT URINALYSIS DIP (MANUAL ENTRY)
Bilirubin, UA: NEGATIVE
Glucose, UA: NEGATIVE mg/dL
Ketones, POC UA: NEGATIVE mg/dL
Leukocytes, UA: NEGATIVE
Nitrite, UA: NEGATIVE
Protein Ur, POC: NEGATIVE mg/dL
Spec Grav, UA: 1.025 (ref 1.010–1.025)
Urobilinogen, UA: 0.2 U/dL
pH, UA: 5.5 (ref 5.0–8.0)

## 2024-02-29 MED ORDER — TAMSULOSIN HCL 0.4 MG PO CAPS: 0.4000 mg | ORAL_CAPSULE | Freq: Every day | ORAL | 0 refills | Status: AC

## 2024-02-29 MED ORDER — CYCLOBENZAPRINE HCL 10 MG PO TABS: 10.0000 mg | ORAL_TABLET | Freq: Two times a day (BID) | ORAL | 0 refills | Status: AC | PRN

## 2024-02-29 NOTE — Discharge Instructions (Addendum)
 You can take the muscle relaxer Flexeril  twice daily. If the medication makes you drowsy, take only at bed time. This medication is prescription only.  For over the counter medications: For back pain I recommend tylenol  650 mg every 6 hours for pain (two of the 325 mg tablets at once) Take pepcid (famotidine) one or two times daily for reflux/bloating symptoms  I recommend going to the [facility] as they have the cheapest prices  You can scan the QR code on the last page to get established with a primary care provider for follow up  I am also treating for possible kidney stone although I do have lower concern for this given intermittent symptoms for a month. The Flomax is taken one capsule daily. I recommend to take in the morning as it an make you urinate more.

## 2024-02-29 NOTE — ED Triage Notes (Signed)
 Patient reports that he has been having left mid abdominal pain that radiates into the back x 1 month. Patient states it is a burning type pain.  Patient states he has taken Excedrin  which helps, but only for a short time.  Patient denies any N/V/D.

## 2024-02-29 NOTE — ED Provider Notes (Signed)
 [facility]    CSN: [id number] Arrival date & time: 02/29/24  1022      History   Chief Complaint Chief Complaint  Patient presents with   Back Pain    HPI Derek Newton is a 38 y.o. male.  Presents with 1 month history of intermittent left-sided back/flank pain.  Comes and goes. No injury or trauma known. He has also had some bloating and reflux, especially after eating spicy food. Not having abdominal pain.  Not having fever, nausea, vomiting.  Denies any urinary symptoms including dysuria, hematuria, urgency  Has been taking Excedrin which helps temporarily.  Has also been trying to avoid spicy food   Past Medical History:  Diagnosis Date   Anxiety     There are no active problems to display for this patient.   History reviewed. No pertinent surgical history.     Home Medications    Prior to Admission medications   Medication Sig Start Date End Date Taking? Authorizing Provider  cyclobenzaprine  (FLEXERIL ) 10 MG tablet Take 1 tablet (10 mg total) by mouth 2 (two) times daily as needed for muscle spasms. 02/29/24  Yes Tricia Oaxaca, Ivette Marks, PA-C  tamsulosin (FLOMAX) 0.4 MG CAPS capsule Take 1 capsule (0.4 mg total) by mouth daily. 02/29/24  Yes Zayden Maffei, Ivette Marks, PA-C  albuterol  (VENTOLIN  HFA) 108 (90 Base) MCG/ACT inhaler Inhale 1-2 puffs into the lungs every 6 (six) hours as needed for wheezing or shortness of breath. 09/29/23   Hermanns, Ashlee P, PA-C  desloratadine  (CLARINEX ) 5 MG tablet Take 1 tablet (5 mg total) by mouth daily. 09/29/11 09/28/12  Palumbo, April, MD  hydrOXYzine  (ATARAX ) 10 MG tablet Take 1 tablet (10 mg total) by mouth 3 (three) times daily as needed for anxiety. 11/30/23   Karon Packer, NP  ondansetron  (ZOFRAN -ODT) 4 MG disintegrating tablet Take 1 tablet (4 mg total) by mouth every 8 (eight) hours as needed for nausea or vomiting. 10/22/22   Lamptey, Donley Furth, MD    Family History Family History  Problem Relation Age of  Onset   Diabetes Mother    Diabetes Father     Social History Social History   Tobacco Use   Smoking status: Former    Current packs/day: 0.00    Types: Cigarettes    Quit date: 10/20/2005    Years since quitting: 18.3   Smokeless tobacco: Never  Vaping Use   Vaping status: Never Used  Substance Use Topics   Alcohol use: No   Drug use: No     Allergies   Watermelon flavoring agent (non-screening)   Review of Systems Review of Systems Per HPI  Physical Exam Triage Vital Signs ED Triage Vitals  Encounter Vitals Group     BP 02/29/24 1044 (!) 135/91     Systolic BP Percentile --      Diastolic BP Percentile --      Pulse Rate 02/29/24 1044 98     Resp 02/29/24 1044 16     Temp 02/29/24 1044 98 F (36.7 C)     Temp Source 02/29/24 1044 Oral     SpO2 02/29/24 1044 94 %     Weight --      Height --      Head Circumference --      Peak Flow --      Pain Score 02/29/24 1043 5     Pain Loc --      Pain Education --      Exclude from  Growth Chart --    No data found.  Updated Vital Signs BP (!) 135/91 (BP Location: Left Arm)   Pulse 98   Temp 98 F (36.7 C) (Oral)   Resp 16   SpO2 94%   Visual Acuity Right Eye Distance:   Left Eye Distance:   Bilateral Distance:    Right Eye Near:   Left Eye Near:    Bilateral Near:     Physical Exam Vitals and nursing note reviewed.  Constitutional:      Appearance: Normal appearance.  HENT:     Mouth/Throat:     Mouth: Mucous membranes are moist.     Pharynx: Oropharynx is clear.  Eyes:     Conjunctiva/sclera: Conjunctivae normal.  Cardiovascular:     Rate and Rhythm: Normal rate and regular rhythm.     Heart sounds: Normal heart sounds.  Pulmonary:     Effort: Pulmonary effort is normal. No respiratory distress.     Breath sounds: Normal breath sounds.  Abdominal:     General: Bowel sounds are normal.     Palpations: Abdomen is soft.     Tenderness: There is no abdominal tenderness. There is left CVA  tenderness. There is no right CVA tenderness, guarding or rebound.  Musculoskeletal:        General: Normal range of motion.     Thoracic back: Tenderness present.       Back:     Comments: Left side back pain, point tenderness to palpation. CVA vs thoracic/lumbar. No bony tenderness of spine. No rash, erythema, or lesions.    Skin:    General: Skin is warm and dry.  Neurological:     Mental Status: He is alert and oriented to person, place, and time.     Sensory: Sensation is intact.     Motor: Motor function is intact.     Coordination: Coordination is intact.     Gait: Gait is intact.     Comments: Strength and sensation equal and intact     UC Treatments / Results  Labs (all labs ordered are listed, but only abnormal results are displayed) Labs Reviewed  POCT URINALYSIS DIP (MANUAL ENTRY) - Abnormal; Notable for the following components:      Result Value   Blood, UA small (*)    All other components within normal limits  URINE CULTURE    EKG  Radiology No results found.  Procedures Procedures (including critical care time)  Medications Ordered in UC Medications - No data to display  Initial Impression / Assessment and Plan / UC Course  I have reviewed the triage vital signs and the nursing notes.  Pertinent labs & imaging results that were available during my care of the patient were reviewed by me and considered in my medical decision making (see chart for details).  Afebrile, stable vitals, well appearing Left thoracic back pain vs flank pain Muscles are tender to palpation, less likely kidney stone. However UA obtained and has small blood. Less likely infection but will culture urine. Will treat for several etiologies; Reflux and bloating sensation -- pepcid BID and avoid triggering foods. Flank/back pain -- treat with tylenol , avoid NSAIDs due to reflux. Also try flexeril  BID prn with drowsy precautions. Will also treat for possible kidney stone with  daily flomax. Recommend establish with PCP as soon as able and follow up Return and ED precautions  Final Clinical Impressions(s) / UC Diagnoses   Final diagnoses:  Acute left-sided thoracic back pain  Gastroesophageal reflux disease without esophagitis  Microscopic hematuria     Discharge Instructions      You can take the muscle relaxer Flexeril  twice daily. If the medication makes you drowsy, take only at bed time. This medication is prescription only.  For over the counter medications: For back pain I recommend tylenol  650 mg every 6 hours for pain (two of the 325 mg tablets at once) Take pepcid (famotidine) one or two times daily for reflux/bloating symptoms  I recommend going to the [facility] as they have the cheapest prices  You can scan the QR code on the last page to get established with a primary care provider for follow up  I am also treating for possible kidney stone although I do have lower concern for this given intermittent symptoms for a month. The Flomax is taken one capsule daily. I recommend to take in the morning as it an make you urinate more.  ED Prescriptions     Medication Sig Dispense Auth. Provider   cyclobenzaprine  (FLEXERIL ) 10 MG tablet Take 1 tablet (10 mg total) by mouth 2 (two) times daily as needed for muscle spasms. 20 tablet Kiven Vangilder, PA-C   tamsulosin (FLOMAX) 0.4 MG CAPS capsule Take 1 capsule (0.4 mg total) by mouth daily. 30 capsule Annastasia Haskins, Ivette Marks, PA-C      PDMP not reviewed this encounter.   Lokelani Lutes, Ivette Marks, [place] 02/29/24 1213

## 2024-03-01 ENCOUNTER — Ambulatory Visit: Payer: Self-pay | Admitting: Emergency Medicine

## 2024-03-01 LAB — URINE CULTURE: Culture: NO GROWTH
